# Patient Record
Sex: Male | Born: 1965 | Race: White | Hispanic: No | Marital: Married | Smoking: Never smoker
Health system: Southern US, Academic
[De-identification: ages and names within clinical notes are randomized; demographics above are authoritative.]

## PROBLEM LIST (undated history)

## (undated) DIAGNOSIS — C801 Malignant (primary) neoplasm, unspecified: Secondary | ICD-10-CM

## (undated) DIAGNOSIS — R112 Nausea with vomiting, unspecified: Secondary | ICD-10-CM

## (undated) DIAGNOSIS — G4730 Sleep apnea, unspecified: Secondary | ICD-10-CM

## (undated) DIAGNOSIS — R21 Rash and other nonspecific skin eruption: Secondary | ICD-10-CM

## (undated) DIAGNOSIS — Z9989 Dependence on other enabling machines and devices: Secondary | ICD-10-CM

## (undated) DIAGNOSIS — Z9889 Other specified postprocedural states: Secondary | ICD-10-CM

## (undated) DIAGNOSIS — E785 Hyperlipidemia, unspecified: Secondary | ICD-10-CM

## (undated) DIAGNOSIS — C9030 Solitary plasmacytoma not having achieved remission: Secondary | ICD-10-CM

## (undated) DIAGNOSIS — K219 Gastro-esophageal reflux disease without esophagitis: Secondary | ICD-10-CM

## (undated) DIAGNOSIS — M129 Arthropathy, unspecified: Secondary | ICD-10-CM

## (undated) DIAGNOSIS — I10 Essential (primary) hypertension: Secondary | ICD-10-CM

## (undated) DIAGNOSIS — T884XXA Failed or difficult intubation, initial encounter: Secondary | ICD-10-CM

## (undated) HISTORY — PX: HX OTHER: 2100001105

## (undated) HISTORY — DX: Solitary plasmacytoma not having achieved remission (CMS HCC): C90.30

## (undated) HISTORY — PX: HX KNEE SURGERY: 2100001320

## (undated) HISTORY — PX: HX TONSILLECTOMY: SHX27

---

## 2004-11-25 ENCOUNTER — Ambulatory Visit (HOSPITAL_COMMUNITY): Payer: Self-pay | Admitting: Orthopaedic Surgery

## 2014-08-16 ENCOUNTER — Encounter (HOSPITAL_BASED_OUTPATIENT_CLINIC_OR_DEPARTMENT_OTHER): Payer: Self-pay

## 2014-08-16 ENCOUNTER — Ambulatory Visit (HOSPITAL_BASED_OUTPATIENT_CLINIC_OR_DEPARTMENT_OTHER): Payer: BC Managed Care – PPO

## 2014-08-16 ENCOUNTER — Ambulatory Visit
Admission: RE | Admit: 2014-08-16 | Discharge: 2014-08-16 | Disposition: A | Discharge status: Home / Self Care | Payer: BC Managed Care – PPO | Source: Ambulatory Visit

## 2014-08-16 VITALS — BP 139/79 | HR 81 | Temp 97.9°F | Ht 72.0 in | Wt 298.5 lb

## 2014-08-16 DIAGNOSIS — F17220 Nicotine dependence, chewing tobacco, uncomplicated: Secondary | ICD-10-CM | POA: Insufficient documentation

## 2014-08-16 DIAGNOSIS — M25579 Pain in unspecified ankle and joints of unspecified foot: Secondary | ICD-10-CM

## 2014-08-16 DIAGNOSIS — E780 Pure hypercholesterolemia: Secondary | ICD-10-CM | POA: Insufficient documentation

## 2014-08-16 DIAGNOSIS — Z9889 Other specified postprocedural states: Secondary | ICD-10-CM | POA: Insufficient documentation

## 2014-08-16 DIAGNOSIS — I10 Essential (primary) hypertension: Secondary | ICD-10-CM | POA: Insufficient documentation

## 2014-08-16 DIAGNOSIS — M25771 Osteophyte, right ankle: Secondary | ICD-10-CM | POA: Insufficient documentation

## 2014-08-16 DIAGNOSIS — M19071 Primary osteoarthritis, right ankle and foot: Secondary | ICD-10-CM | POA: Insufficient documentation

## 2014-08-16 DIAGNOSIS — M25871 Other specified joint disorders, right ankle and foot: Secondary | ICD-10-CM | POA: Insufficient documentation

## 2014-08-16 NOTE — H&P (Addendum)
[place], [place] [address]                                PATIENT NAME: [name], [name]  HOSPITAL RSWNIO:[id number]  DATE OF SERVICE:08/16/2014  DATE OF BIRTH: August 30, 1966    HISTORY AND PHYSICAL    CHIEF COMPLAINT:  Right ankle pain.    HISTORY OF PRESENT ILLNESS:  The patient is in today for evaluation of his right ankle pain.  This ankle pain has been going on for almost a decade and is has progressively been getting worse.  He has tried several nonoperative and operative interventions.  He states that the most pain relief he ever got was when he was taking steroids about 5 mg a day.  Advil also helps a little.  He did receive an injection at some point about 8 years ago which provided some relief as well.  He has undergone 3 different surgeries.  He is not sure what they were, but looking at the imaging and talking to him, it sounds like he received an arthroscopic debridement about 8 years ago.  A couple years after that, he underwent what looked like some kind of OATS procedure with a medial malleolar osteotomy and a plantar fascia release and then several years after that, he underwent another surgery for hardware removal and removal of osteophytes.  None of these relieved any of his symptoms.  He continues to have pain, especially with ambulation.  He states that he can walk very little distances before his ankle pain is too much.  He has tried [place] braces which do not help.  He states it does not matter if it is uneven ground or flat ground.  He can go a little bit longer on flat ground but it is still almost negligible.  He has never officially been diagnosed with any kind of rheumatologic disease but a rheumatologist did state that she thought he may have psoriatic arthritis as well as gout.  He denies any trauma to the ankle.  He denies any nausea, vomiting,  fevers, chills, chest pain, shortness breath, diarrhea or constipation.    ALLERGIES:  No known drug allergies.    PAST MEDICAL HISTORY:  Hypercholesterolemia, hypertension, possibly gout and possibly psoriatic arthritis.      CURRENT MEDICATIONS:  1.  Azor.  2.  Nexium.  3.  Lipitor.  4.  Colchicine.    PAST SURGICAL HISTORY:  The 3 ankle surgeries and a knee arthroscopy.    SOCIAL HISTORY:  Chews less than a can a day.  Drinks about 12-30 beers a week.  Denies smoking or drug use.    FAMILY HISTORY:  Significant for hypertension.    REVIEW OF SYSTEMS:  As per HPI, otherwise, all other review of systems are negative.    PHYSICAL EXAMINATION:  General:  No apparent distress.  HEENT:  Normocephalic, atraumatic.  Respirations:  Nonlabored.  Cardiovascular:  Regular rate.  Abdomen:  Nondistended.  Skin:  Warm and dry.  Psych:  Mood and affect appropriate for clinical situation.  Extremity exam:  Right lower extremity reveals no lacerations, abrasions, edema or ecchymosis.  There is a small ankle effusion.  The patient is tender to palpation along the dorsum of his foot around the midfoot as well as in the medial and lateral gutter and in the posterior aspect of the ankle joint.  He is only able to dorsiflex to neutral and that remains unchanged when he flexed the knee.  He can plantar flex about 20 degrees.  Extremes of dorsiflexion and plantar flexion, especially dorsiflexion, cause him a significant amount of tenderness.  When isolating the midfoot and rotating the foot around the midfoot, that is where he is exquisitely tender.  He has very little subtalar range of motion.  He does have intact sensation and brisk capillary refill.  All surgical scars are well-healed.    IMAGING:  X-rays taken today in clinic of his right ankle were reviewed which showed a significant joint space narrowing of the tibiotalar joint as well as the talonavicular joint.  There are some subchondral cysts and sclerotic changes as well as  osteophytes both anterior and posterior of the ankle joint.      An MRI on [employer] was also reviewed which showed multi-joint arthrosis with several subchondral cystic changes.    ASSESSMENT AND PLAN:  The patient has severe right ankle and midfoot arthritis.  We spoke to him about all of his options.  He has pretty much failed every single nonoperative management.  We did talk to him about a fusion, but it would have to be a pantalar fusion and in someone his age, we felt that would not be conducive to his lifestyle and it would cause him just as much pain and discomfort.  We spoke to him about an ankle arthroplasty.  We would actually have to do an ankle arthroplasty and at the same time fuse the subtalar and tibiotalar joint.  We would use a claw plate for the tibiotalar joint and Endo-Fuse rods for the subtalar joint.  The patient was in agreement with this and he wished to proceed.  We turned in a card and we will contact him to plan his surgery.  We did tell him that he had to be completely off tobacco products before we could perform the surgery safely.      Christell Faith, MD  Resident  [place] Department of Orthopaedics    Inda Coke, MD  Assistant Professor  [facility] Department of Orthopaedics    [id number]; D: 08/16/2014 10:59:19; T: 08/16/2014 11:51:16                 I saw and examined the patient.  I directly supervised the resident's activities and procedures.  I reviewed the resident's note.  I agree with the findings and plan of care as documented in the resident's note.    Fulton Reek, MD  Assistant Professor  Chief of Foot & Ankle Surgery  Department of Orthopaedics  [place] [facility]  Operated by [facility]  08/20/2014, 07:56

## 2014-08-18 NOTE — Progress Notes (Signed)
[place],  [id number]                                PATIENT NAME: [name], [name]  HOSPITAL WHQPRF:[id number]  DATE OF SERVICE:08/16/2014  DATE OF BIRTH: [birth year]    PROGRESS NOTE    Procedure is a right total ankle arthroplasty with simultaneous ipsilateral subtalar fusion and talonavicular fusions.  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  A 2-hour surgical window is requested.  Femoral sciatic block with catheter is requested.  The supine position is noted.  The patient will require the large C-arm as well as claw plate, MUC screws, Endo-Fuse rods and the Prophecy to be performed.  We will admit the patient to the [place] unit postoperatively for 1-2 nights.    SPECIAL NOTATION:  The patient needs a nicotine test prior to surgery.  This has to be done 2 weeks prior to be sure that he will heal.    The patient will have an extended period time for mobilization because this is a fusion.  We will not allow him to weightbear until 6 weeks.  We will hopefully start range of motion around the 2-1/2 week mark.    The patient needs to be set up for the prophecy CT scan and medical clearance.      Inda Coke, MD  Assistant Professor  [facility] Department of Orthopaedics    [id number]; D: 08/17/2014 15:44:10; T: 08/18/2014 16:08:33

## 2014-09-04 ENCOUNTER — Other Ambulatory Visit (INDEPENDENT_AMBULATORY_CARE_PROVIDER_SITE_OTHER): Payer: Self-pay | Admitting: Family Medicine

## 2014-09-04 DIAGNOSIS — M25579 Pain in unspecified ankle and joints of unspecified foot: Secondary | ICD-10-CM

## 2014-09-18 ENCOUNTER — Ambulatory Visit
Admission: RE | Admit: 2014-09-18 | Discharge: 2014-09-18 | Disposition: A | Discharge status: Home / Self Care | Payer: BC Managed Care – PPO | Source: Ambulatory Visit | Attending: Family Medicine | Admitting: Family Medicine

## 2014-09-18 DIAGNOSIS — M25579 Pain in unspecified ankle and joints of unspecified foot: Secondary | ICD-10-CM | POA: Insufficient documentation

## 2014-10-24 ENCOUNTER — Encounter (INDEPENDENT_AMBULATORY_CARE_PROVIDER_SITE_OTHER): Payer: Self-pay

## 2014-10-24 ENCOUNTER — Ambulatory Visit (INDEPENDENT_AMBULATORY_CARE_PROVIDER_SITE_OTHER): Payer: Self-pay | Admitting: Family Medicine

## 2014-10-24 ENCOUNTER — Ambulatory Visit
Admission: RE | Admit: 2014-10-24 | Discharge: 2014-10-24 | Disposition: A | Discharge status: Home / Self Care | Payer: BC Managed Care – PPO | Source: Ambulatory Visit

## 2014-10-24 ENCOUNTER — Encounter (INDEPENDENT_AMBULATORY_CARE_PROVIDER_SITE_OTHER): Payer: Self-pay | Admitting: Family Medicine

## 2014-10-24 ENCOUNTER — Ambulatory Visit (HOSPITAL_BASED_OUTPATIENT_CLINIC_OR_DEPARTMENT_OTHER): Payer: BC Managed Care – PPO | Admitting: Family Medicine

## 2014-10-24 VITALS — BP 129/85 | HR 79 | Temp 98.1°F | Wt 299.8 lb

## 2014-10-24 DIAGNOSIS — M13871 Other specified arthritis, right ankle and foot: Secondary | ICD-10-CM | POA: Insufficient documentation

## 2014-10-24 DIAGNOSIS — Z01818 Encounter for other preprocedural examination: Secondary | ICD-10-CM

## 2014-10-24 DIAGNOSIS — M25579 Pain in unspecified ankle and joints of unspecified foot: Principal | ICD-10-CM

## 2014-10-24 DIAGNOSIS — Z87891 Personal history of nicotine dependence: Secondary | ICD-10-CM | POA: Insufficient documentation

## 2014-10-24 HISTORY — DX: Hyperlipidemia, unspecified: E78.5

## 2014-10-24 HISTORY — DX: Sleep apnea, unspecified: G47.30

## 2014-10-24 HISTORY — DX: Gastro-esophageal reflux disease without esophagitis: K21.9

## 2014-10-24 HISTORY — DX: Rash and other nonspecific skin eruption: R21

## 2014-10-24 HISTORY — DX: Essential (primary) hypertension: I10

## 2014-10-24 HISTORY — DX: Failed or difficult intubation, initial encounter: T88.4XXA

## 2014-10-24 HISTORY — DX: Dependence on other enabling machines and devices: Z99.89

## 2014-10-24 LAB — PERFORM POC WHOLE BLOOD GLUCOSE: GLUCOSE, POINT OF CARE: 97 mg/dL (ref 70–105)

## 2014-10-24 NOTE — Anesthesia Preprocedure Evaluation (Addendum)
Physical Exam:     Airway       Mallampati: IV    TM distance: <3 FB    Neck ROM: full  Mouth Opening: poor.            Dental       Dentition intact             Pulmonary    Breath sounds clear to auscultation  (-) no rhonchi, no decreased breath sounds, no wheezes, no rales and no stridor     Cardiovascular    Rhythm: regular  Rate: Normal  (-) no friction rub, carotid bruit is not present, no peripheral edema and no murmur     Other findings          OR scheduling notified of poss difficult intubation, pt scheduled on [facility].    Anesthesia Plan:  Planned anesthesia type: general  ASA 3     Intravenous induction   Patient's NPO status is appropriate for Anesthesia.    Anesthetic plan and risks discussed with patient.    Anesthesia issues/risks discussed are: Stroke, Intraoperative Awareness/ Recall, Cardiac Events/MI, Aspiration and PONV.    Use of blood products discussed with patient whom.     Plan discussed with CRNA.                     EKG: N/A  CXR: N/A  Other Studies: None      Consults: medical clearance Dr. Perlie Mayo with labs, EKG and cxr    Patient instructed to take the following medications day of surgery, nexium.Copy of Anesthesia Consent provided to patient or guardian to review prior to procedure. Pt instructed that consent will be signed prior to procedure with anesthesiologist.Instructed to hold vitamins/herbs and NSAID 1 week prior to procedure.Instructed to hold ace inhibitor/ARB/Renin inhibitor 24 hours prior to surgery.

## 2014-10-27 LAB — NICOTINE AND METABOLITES, SERUM
COTININE SERUM: <2 ng/mL
NICOTINE SERUM: <2 ng/mL

## 2014-10-29 NOTE — H&P (Addendum)
[place], [place] [address]                                PATIENT NAME: [name], [name]  HOSPITAL IWPYKD:[id number]  DATE OF SERVICE:10/24/2014  DATE OF BIRTH: 06-Jan-1966    HISTORY AND PHYSICAL    SUBJECTIVE:  [name] is here today for his history and physical.  Operative plan is a right total ankle arthroplasty with ipsilateral subtalar fusion and talonavicular fusion as well.  It was noted that a nicotine test will be obtained at today's visit, which he states he has been nicotine-free for the past month.  He does chew tobacco but is using a nicotine-free type of chewing tobacco which he shows me at today's visit.  The patient had presented to the foot and ankle clinic on August 14, 2014, in regards to right ankle pain that has been going on for almost a decade and progressively getting worse.  He has tried several nonoperative interventions in the past.  He states most of his pain relief he has gotten when he is taking steroids.  Advil helps slightly as well.  He has received injections in the past which has provided minimal relief.  He has undergone 3 different surgeries, which we believe to be an arthroscopic debridement from what he had described as well as some sort of OATS procedure with a medial malleolar osteotomy and plantar fascia release several years after that as well.  He states that he then had hardware removal and removal of osteophytes thereafter.  Symptoms have not been relieved.  He continues to have pain, especially with ambulation.  He can walk very little distances before his ankle becomes increasingly painful.  He has tried an [place] brace in the past which did not help.  He has never officially been diagnosed with a rheumatologic disease but a rheumatologist did state that they had thought that he had psoriatic arthritis as well as gout  which he states he is out of his colchicine at today's visit.    ALLERGIES:  No known drug allergies are listed.    PAST MEDICAL HISTORY:  Hypercholesterolemia, hypertension, possibly gout and possibly psoriatic arthritis per patient.    CURRENT MEDICATIONS:    1.  Nexium.  2.  Lipitor.  3.  Colchicine.    PAST SURGICAL HISTORY:  Three ankle surgeries as noted above and a knee arthroscopy.    SOCIAL HISTORY:  He does chew tobacco, which he states he has gone to a nicotine-free type of chewing tobacco for the past month.  He drinks 12-30 beers a week and denies smoking or drug use.    FAMILY HISTORY:  Significant for hypertension.    REVIEW OF SYSTEMS:  As per HPI, other systems reviewed and are negative.    PHYSICAL EXAMINATION:  The patient is in no acute distress.  Vital signs:  Blood pressure is 129/85, pulse 79, temperature is 98 degrees Fahrenheit, weight is 136 kg, height was not obtained.  The patient is atraumatic, normocephalic, nonlabored breath sounds, nondistended abdomen,  no lymphedema.  Mood is appropriate for the clinical setting.  Skin:  There are no rashes or pruritus noted.  On exam of his right lower extremity, he has no lacerations, abrasions or edema noted.  There is a small ankle effusion present.  He is tender to palpation along the dorsum of his foot around the midfoot, as well as medial and lateral gutter.  He is only able to dorsiflex to neutral, plantarflex to about 20 degrees and very little subtalar motion is noted.  Sensation is intact to light touch.  Brisk capillary refill is present.  All surgical scars are well-healed.    ASSESSMENT:  Severe right ankle and midfoot arthritis.    PLAN:  A fusion was discussed with him at his last office visit but with his lifestyle, he wants to proceed forward with an ankle arthroplasty as well as a subtalar tibiotalar joint fusion.  He was also informed that he will be nicotine tested at today's visit as well if this comes back positive prior to his  procedure that his surgery will be canceled and if it comes back positive after the procedure was performed that he has an increased risk for wound complications and delayed healing.  The patient understood these risks and benefits and he is electing to proceed forward with that this surgery as planned.  A note is dictated by Dr. Irena Cords in regards to this surgery and can be reviewed prior to the procedure.  All questions and concerns were answered.  Consent was obtained and he is to proceed to preadmission testing.      Lillie Columbia, PA-C  [place] Department of Orthopaedics    Inda Coke, MD  Assistant Professor   Department of Orthopaedics    [id number]; D: 10/29/2014 08:23:18; T: 10/29/2014 08:50:44                 Fulton Reek, MD  Assistant Professor  Chief of Foot & Ankle Surgery  Department of [place] [facility]  Operated by [facility] [employer]  10/29/2014, 12:46

## 2014-11-02 MED ORDER — CEFAZOLIN 10 GRAM SOLUTION FOR INJECTION
3.00 g | Freq: Once | INTRAMUSCULAR | Status: AC
Start: 2014-11-05 — End: 2014-11-05
  Administered 2014-11-05 (×2): 3 g via INTRAVENOUS
  Filled 2014-11-02: qty 30

## 2014-11-02 MED ORDER — DEXTROSE 5 % IN WATER (D5W) INTRAVENOUS SOLUTION
3.00 g | Freq: Once | INTRAVENOUS | Status: DC
Start: 2014-11-05 — End: 2014-11-05
  Filled 2014-11-02: qty 30

## 2014-11-05 ENCOUNTER — Inpatient Hospital Stay (HOSPITAL_COMMUNITY): Admit: 2014-11-05 | Discharge: 2014-11-05 | Disposition: A | Discharge status: Home / Self Care | Payer: Self-pay

## 2014-11-05 ENCOUNTER — Inpatient Hospital Stay (HOSPITAL_COMMUNITY): Payer: BC Managed Care – PPO | Admitting: Family

## 2014-11-05 ENCOUNTER — Encounter (HOSPITAL_COMMUNITY): Admission: RE | Disposition: A | Discharge status: Home / Self Care | Payer: Self-pay | Source: Ambulatory Visit

## 2014-11-05 ENCOUNTER — Inpatient Hospital Stay (HOSPITAL_COMMUNITY): Payer: BC Managed Care – PPO

## 2014-11-05 ENCOUNTER — Inpatient Hospital Stay (HOSPITAL_COMMUNITY): Payer: BC Managed Care – PPO | Admitting: Anesthesiology

## 2014-11-05 ENCOUNTER — Inpatient Hospital Stay (HOSPITAL_COMMUNITY)
Admission: RE | Admit: 2014-11-05 | Discharge: 2014-11-05 | Disposition: A | Discharge status: Home / Self Care | Payer: BC Managed Care – PPO | Source: Ambulatory Visit

## 2014-11-05 ENCOUNTER — Inpatient Hospital Stay
Admission: RE | Admit: 2014-11-05 | Discharge: 2014-11-06 | DRG: 470 | Disposition: A | Discharge status: Home / Self Care | Payer: BC Managed Care – PPO | Source: Ambulatory Visit

## 2014-11-05 ENCOUNTER — Encounter (HOSPITAL_COMMUNITY): Payer: Self-pay

## 2014-11-05 ENCOUNTER — Inpatient Hospital Stay (HOSPITAL_COMMUNITY): Payer: BC Managed Care – PPO | Admitting: ANESTHESIOLOGY

## 2014-11-05 DIAGNOSIS — M25571 Pain in right ankle and joints of right foot: Secondary | ICD-10-CM

## 2014-11-05 DIAGNOSIS — M109 Gout, unspecified: Secondary | ICD-10-CM | POA: Diagnosis present

## 2014-11-05 DIAGNOSIS — E780 Pure hypercholesterolemia: Secondary | ICD-10-CM | POA: Diagnosis present

## 2014-11-05 DIAGNOSIS — L4050 Arthropathic psoriasis, unspecified: Secondary | ICD-10-CM | POA: Diagnosis present

## 2014-11-05 DIAGNOSIS — I10 Essential (primary) hypertension: Secondary | ICD-10-CM | POA: Diagnosis present

## 2014-11-05 DIAGNOSIS — M19071 Primary osteoarthritis, right ankle and foot: Secondary | ICD-10-CM

## 2014-11-05 DIAGNOSIS — F17220 Nicotine dependence, chewing tobacco, uncomplicated: Secondary | ICD-10-CM | POA: Diagnosis present

## 2014-11-05 DIAGNOSIS — G8918 Other acute postprocedural pain: Secondary | ICD-10-CM

## 2014-11-05 DIAGNOSIS — M19079 Primary osteoarthritis, unspecified ankle and foot: Secondary | ICD-10-CM | POA: Diagnosis present

## 2014-11-05 DIAGNOSIS — Z8249 Family history of ischemic heart disease and other diseases of the circulatory system: Secondary | ICD-10-CM

## 2014-11-05 SURGERY — ARTHROPLASTY ANKLE TOTAL
Anesthesia: General | Laterality: Right | Wound class: Clean Wound: Uninfected operative wounds in which no inflammation occurred

## 2014-11-05 MED ORDER — PROPOFOL 10 MG/ML IV BOLUS
INJECTION | Freq: Once | INTRAVENOUS | Status: DC | PRN
Start: 2014-11-05 — End: 2014-11-05
  Administered 2014-11-05: 100 mg via INTRAVENOUS
  Administered 2014-11-05: 200 mg via INTRAVENOUS

## 2014-11-05 MED ORDER — AMLODIPINE 5 MG-OLMESARTAN 20 MG TABLET
1.0000 | ORAL_TABLET | Freq: Every day | ORAL | Status: DC
Start: 2014-11-05 — End: 2014-11-05

## 2014-11-05 MED ORDER — ATORVASTATIN 10 MG TABLET
10.0000 mg | ORAL_TABLET | Freq: Every day | ORAL | Status: DC
Start: 2014-11-05 — End: 2014-11-06
  Administered 2014-11-05 – 2014-11-06 (×2): 10 mg via ORAL
  Filled 2014-11-05 (×2): qty 1

## 2014-11-05 MED ORDER — DEXTROSE 5 % IN WATER (D5W) INTRAVENOUS SOLUTION
2.0000 g | Freq: Three times a day (TID) | INTRAVENOUS | Status: AC
Start: 2014-11-05 — End: 2014-11-06
  Administered 2014-11-05: 2 g via INTRAVENOUS
  Administered 2014-11-06: 0 g via INTRAVENOUS
  Administered 2014-11-06: 2 g via INTRAVENOUS
  Administered 2014-11-06: 0 g via INTRAVENOUS
  Administered 2014-11-06: 2 g via INTRAVENOUS
  Filled 2014-11-05 (×4): qty 20

## 2014-11-05 MED ORDER — ASPIRIN 325 MG TABLET,DELAYED RELEASE
325.0000 mg | DELAYED_RELEASE_TABLET | Freq: Every day | ORAL | Status: AC
Start: 2014-11-05 — End: 2014-12-17

## 2014-11-05 MED ORDER — VALSARTAN 160 MG TABLET
160.0000 mg | ORAL_TABLET | Freq: Every day | ORAL | Status: DC
Start: 2014-11-05 — End: 2014-11-06
  Administered 2014-11-05 – 2014-11-06 (×2): 160 mg via ORAL
  Filled 2014-11-05 (×2): qty 1

## 2014-11-05 MED ORDER — NEOSTIGMINE METHYLSULFATE 1 MG/ML INTRAVENOUS SOLUTION
Freq: Once | INTRAVENOUS | Status: DC | PRN
Start: 2014-11-05 — End: 2014-11-05
  Administered 2014-11-05: 4 mg via INTRAVENOUS

## 2014-11-05 MED ORDER — HYDROMORPHONE 1 MG/ML INJECTION WRAPPER
0.20 mg | INJECTION | INTRAMUSCULAR | Status: DC | PRN
Start: 2014-11-05 — End: 2014-11-05
  Filled 2014-11-05 (×2): qty 1

## 2014-11-05 MED ORDER — MIDAZOLAM 1 MG/ML INJECTION SOLUTION
INTRAMUSCULAR | Status: AC
Start: 2014-11-05 — End: 2014-11-05
  Filled 2014-11-05: qty 2

## 2014-11-05 MED ORDER — MIDAZOLAM 1 MG/ML INJECTION SOLUTION
2.00 mg | Freq: Once | INTRAMUSCULAR | Status: DC | PRN
Start: 2014-11-05 — End: 2014-11-05
  Administered 2014-11-05: 2 mg via INTRAVENOUS

## 2014-11-05 MED ORDER — SODIUM CHLORIDE 0.9 % INTRAVENOUS SOLUTION
6.25 mg | Freq: Once | INTRAVENOUS | Status: DC | PRN
Start: 2014-11-05 — End: 2014-11-05

## 2014-11-05 MED ORDER — ONDANSETRON HCL (PF) 4 MG/2 ML INJECTION SOLUTION
4.00 mg | Freq: Once | INTRAMUSCULAR | Status: DC | PRN
Start: 2014-11-05 — End: 2014-11-05

## 2014-11-05 MED ORDER — ROPIVACAINE (PF) 5 MG/ML (0.5 %) INJECTION SOLUTION
30.00 mL | Freq: Once | INTRAMUSCULAR | Status: DC | PRN
Start: 2014-11-05 — End: 2014-11-05
  Administered 2014-11-05: 30 mL via INTRAMUSCULAR
  Filled 2014-11-05: qty 30

## 2014-11-05 MED ORDER — ROPIVACAINE (PF) 5 MG/ML (0.5 %) INJECTION SOLUTION
10.00 mL | Freq: Once | INTRAMUSCULAR | Status: AC
Start: 2014-11-05 — End: 2014-11-05
  Administered 2014-11-05: 10 mL
  Filled 2014-11-05: qty 10

## 2014-11-05 MED ORDER — SODIUM CHLORIDE 0.9 % (FLUSH) INJECTION SYRINGE
2.00 mL | INJECTION | Freq: Three times a day (TID) | INTRAMUSCULAR | Status: DC
Start: 2014-11-05 — End: 2014-11-06
  Administered 2014-11-05 – 2014-11-06 (×3): 0 mL
  Administered 2014-11-06: 2 mL

## 2014-11-05 MED ORDER — OXYCODONE-ACETAMINOPHEN 5 MG-325 MG TABLET
1.0000 | ORAL_TABLET | ORAL | Status: DC | PRN
Start: 2014-11-05 — End: 2014-11-06

## 2014-11-05 MED ORDER — SENNOSIDES 8.6 MG-DOCUSATE SODIUM 50 MG TABLET
1.00 | ORAL_TABLET | Freq: Two times a day (BID) | ORAL | Status: DC
Start: 2014-11-05 — End: 2015-01-22

## 2014-11-05 MED ORDER — OXYCODONE-ACETAMINOPHEN 5 MG-325 MG TABLET
1.00 | ORAL_TABLET | ORAL | Status: DC | PRN
Start: 2014-11-05 — End: 2015-01-22

## 2014-11-05 MED ORDER — ROCURONIUM 50 MG/5 ML (10 MG/ML) INTRAVENOUS SYRINGE
INJECTION | Freq: Once | INTRAVENOUS | Status: DC | PRN
Start: 2014-11-05 — End: 2014-11-05
  Administered 2014-11-05: 10 mg via INTRAVENOUS
  Administered 2014-11-05: 50 mg via INTRAVENOUS
  Administered 2014-11-05: 10 mg via INTRAVENOUS

## 2014-11-05 MED ORDER — ONDANSETRON HCL (PF) 4 MG/2 ML INJECTION SOLUTION
4.0000 mg | Freq: Four times a day (QID) | INTRAMUSCULAR | Status: DC | PRN
Start: 2014-11-05 — End: 2014-11-06

## 2014-11-05 MED ORDER — AMLODIPINE 5 MG TABLET
5.0000 mg | ORAL_TABLET | Freq: Every day | ORAL | Status: DC
Start: 2014-11-05 — End: 2014-11-06
  Administered 2014-11-05 – 2014-11-06 (×2): 5 mg via ORAL
  Filled 2014-11-05 (×2): qty 1

## 2014-11-05 MED ORDER — SODIUM CHLORIDE 0.9 % (FLUSH) INJECTION SYRINGE
20.00 mL | INJECTION | Freq: Once | INTRAMUSCULAR | Status: DC | PRN
Start: 2014-11-05 — End: 2014-11-05

## 2014-11-05 MED ORDER — SENNOSIDES 8.6 MG-DOCUSATE SODIUM 50 MG TABLET
1.0000 | ORAL_TABLET | Freq: Two times a day (BID) | ORAL | Status: DC
Start: 2014-11-05 — End: 2014-11-06
  Administered 2014-11-05 – 2014-11-06 (×3): 1 via ORAL
  Filled 2014-11-05 (×5): qty 1

## 2014-11-05 MED ORDER — ACETAMINOPHEN 1,000 MG/100 ML (10 MG/ML) INTRAVENOUS SOLUTION
Freq: Once | INTRAVENOUS | Status: DC | PRN
Start: 2014-11-05 — End: 2014-11-05
  Administered 2014-11-05: 1000 mg via INTRAVENOUS

## 2014-11-05 MED ORDER — GLYCOPYRROLATE 0.2 MG/ML INJECTION SOLUTION
Freq: Once | INTRAMUSCULAR | Status: DC | PRN
Start: 2014-11-05 — End: 2014-11-05
  Administered 2014-11-05: 0.4 mg via INTRAVENOUS

## 2014-11-05 MED ORDER — LACTATED RINGERS INTRAVENOUS SOLUTION
INTRAVENOUS | Status: DC
Start: 2014-11-05 — End: 2014-11-06

## 2014-11-05 MED ORDER — SODIUM CHLORIDE 0.9 % INTRAVENOUS SOLUTION
INTRAVENOUS | Status: DC
Start: 2014-11-05 — End: 2014-11-06
  Filled 2014-11-05: qty 110

## 2014-11-05 MED ORDER — EPHEDRINE SULFATE 50 MG/ML INJECTION SOLUTION
Freq: Once | INTRAMUSCULAR | Status: DC | PRN
Start: 2014-11-05 — End: 2014-11-05
  Administered 2014-11-05 (×2): 10 mg via INTRAVENOUS

## 2014-11-05 MED ORDER — OXYCODONE-ACETAMINOPHEN 5 MG-325 MG TABLET
2.0000 | ORAL_TABLET | ORAL | Status: DC | PRN
Start: 2014-11-05 — End: 2014-11-06
  Administered 2014-11-05 – 2014-11-06 (×7): 2 via ORAL
  Filled 2014-11-05 (×7): qty 2

## 2014-11-05 MED ORDER — FENTANYL (PF) 50 MCG/ML INJECTION SOLUTION
100.00 ug | Freq: Once | INTRAMUSCULAR | Status: DC | PRN
Start: 2014-11-05 — End: 2014-11-05
  Administered 2014-11-05: 50 ug via INTRAVENOUS
  Administered 2014-11-05: 100 ug via INTRAVENOUS
  Administered 2014-11-05: 50 ug via INTRAVENOUS

## 2014-11-05 MED ORDER — DEXAMETHASONE SODIUM PHOSPHATE 4 MG/ML INJECTION SOLUTION
4.00 mg | Freq: Once | INTRAMUSCULAR | Status: DC | PRN
Start: 2014-11-05 — End: 2014-11-05

## 2014-11-05 MED ORDER — HYDROMORPHONE 1 MG/ML INJECTION WRAPPER
0.40 mg | INJECTION | INTRAMUSCULAR | Status: DC | PRN
Start: 2014-11-05 — End: 2014-11-06
  Administered 2014-11-05 – 2014-11-06 (×5): 0.4 mg via INTRAVENOUS
  Filled 2014-11-05 (×5): qty 1

## 2014-11-05 MED ORDER — HYDROMORPHONE 1 MG/ML INJECTION WRAPPER
0.2000 mg | INJECTION | INTRAMUSCULAR | Status: DC | PRN
Start: 2014-11-05 — End: 2014-11-05

## 2014-11-05 MED ORDER — SODIUM CHLORIDE 0.9 % (FLUSH) INJECTION SYRINGE
2.00 mL | INJECTION | INTRAMUSCULAR | Status: DC | PRN
Start: 2014-11-05 — End: 2014-11-06

## 2014-11-05 MED ORDER — ASPIRIN 325 MG TABLET,DELAYED RELEASE
325.0000 mg | DELAYED_RELEASE_TABLET | Freq: Every day | ORAL | Status: DC
Start: 2014-11-05 — End: 2014-11-06
  Administered 2014-11-05 – 2014-11-06 (×2): 325 mg via ORAL
  Filled 2014-11-05 (×2): qty 1

## 2014-11-05 MED ORDER — ONDANSETRON HCL (PF) 4 MG/2 ML INJECTION SOLUTION
Freq: Once | INTRAMUSCULAR | Status: DC | PRN
Start: 2014-11-05 — End: 2014-11-05
  Administered 2014-11-05: 4 mg via INTRAVENOUS

## 2014-11-05 MED ORDER — FENTANYL (PF) 50 MCG/ML INJECTION SOLUTION
INTRAMUSCULAR | Status: AC
Start: 2014-11-05 — End: 2014-11-05
  Filled 2014-11-05: qty 2

## 2014-11-05 MED ORDER — HYDROMORPHONE 1 MG/ML INJECTION WRAPPER
0.40 mg | INJECTION | INTRAMUSCULAR | Status: AC | PRN
Start: 2014-11-05 — End: 2014-11-05
  Administered 2014-11-05 (×5): 0.4 mg via INTRAVENOUS
  Filled 2014-11-05: qty 1

## 2014-11-05 MED ORDER — REMIFENTANIL 50 MCG/ML INFUSION - FOR ANES
INTRAVENOUS | Status: DC | PRN
Start: 2014-11-05 — End: 2014-11-05
  Administered 2014-11-05: 0 ug/kg/min via INTRAVENOUS
  Administered 2014-11-05: 0.05 ug/kg/min via INTRAVENOUS
  Administered 2014-11-05: .07 ug/kg/min via INTRAVENOUS

## 2014-11-05 MED ORDER — SODIUM CHLORIDE 0.9 % (FLUSH) INJECTION SYRINGE
2.00 mL | INJECTION | Freq: Three times a day (TID) | INTRAMUSCULAR | Status: DC
Start: 2014-11-05 — End: 2014-11-06
  Administered 2014-11-05: 0 mL
  Administered 2014-11-06: 2 mL
  Administered 2014-11-06: 0 mL

## 2014-11-05 MED ORDER — LIDOCAINE (PF) 100 MG/5 ML (2 %) INTRAVENOUS SYRINGE
INJECTION | Freq: Once | INTRAVENOUS | Status: DC | PRN
Start: 2014-11-05 — End: 2014-11-05
  Administered 2014-11-05: 80 mg via INTRAVENOUS

## 2014-11-05 MED ADMIN — lactated Ringers intravenous solution: INTRAVENOUS | @ 07:00:00 | NDC 00338011704

## 2014-11-05 MED ADMIN — HYDROmorphone (PF) 1 mg/mL injection solution: INTRAVENOUS | @ 12:00:00

## 2014-11-05 MED ADMIN — SODIUM CHLORIDE 0.9 % W/ ADDITIVES: INTRAVENOUS | @ 12:00:00 | NDC 00338004903

## 2014-11-05 MED ADMIN — electrolyte-A intravenous solution: ORAL | @ 16:00:00 | NDC 00338022104

## 2014-11-05 MED ADMIN — nystatin 100,000 unit/gram topical powder: @ 11:00:00 | NDC 00574200802

## 2014-11-05 MED ADMIN — lactated Ringers intravenous solution: INTRAVENOUS | @ 23:00:00 | NDC 00264775000

## 2014-11-05 SURGICAL SUPPLY — 59 items
BIT DRILL 4.4MM DARCO FOOT ANKL 6.5MM SCREW (SURGICAL CUTTING SUPPLIES) ×1 IMPLANT
BIT DRILL 4.4MM DARCO FT/ANKL_6.5MM SCREW (CUTTING ELEMENTS) ×1
BIT DRILL GRN 2MM CLW II 2.7MM SCREW (SURGICAL CUTTING SUPPLIES) ×1 IMPLANT
BIT DRILL GRN 2MM CLW II 2.7MM_SCREW (CUTTING ELEMENTS) ×1
BLADE SAW 90X11X1.19MM SGTL 2 CUT (SURGICAL CUTTING SUPPLIES) ×1 IMPLANT
BLADE SAW 90X11X1.19MM SGTL 2_CUT (CUTTING ELEMENTS) ×1
BLANKET 3M BAIR HUG ADLT UPR B ODY 74X24IN PLMR 2 INCS ADH (MISCELLANEOUS PT CARE ITEMS) ×2 IMPLANT
CUFF TOURNIQUET PURP 34X4IN COLOR CUF CYL 1 PORT 1 BLADDER QC CONN 40IN STRL LF  DISP (GENE) ×1 IMPLANT
CUFF TOURNIQUET PURP 34X4IN_COLOR CUF CYL 1 PORT 1 BLADDER (GENE) ×1
DOME TALAR INBONE 3 ANKL SULCUS ×1 IMPLANT
DONUT EXTREMITY CUSHIONING 31143137 (POSITIONING PRODUCTS) ×2 IMPLANT
DRAPE BACKTABLE 72IN 10/CS 37501 (Other Miscellaneous) ×2 IMPLANT
DRAPE FNFLD ABS REINF 77X53IN 43528 PRXM LF  STRL DISP SURG SMS 44X23IN (PROTECTIVE PRODUCTS/GARMENTS) IMPLANT
DRAPE FNFLD ABS REINF 77X53IN_43528 PRXM LF STRL DISP SURG (PROTECTIVE PRODUCTS/GARMENTS)
DRILL BIT 3.0MM (ORTHOPEDICS (NOT IMPLANTS)) ×2 IMPLANT
DRILL INBONE SZ 4 200178004_EA (CUTTING ELEMENTS) ×1
DRILL SURG 4 ANTIROTATION NTCH INBONE (SURGICAL CUTTING SUPPLIES) ×1 IMPLANT
DRILL SURG INBONE 6MM NONST DISP (SURGICAL INSTRUMENTS) ×1 IMPLANT
DRILL SURG INBONE 6MM NONST DI_SP (INSTRUMENTS) ×1
DUPE USE ITEM 319452 - SUTURE 0 CT2 VICRYL 27IN VIOL_BRD COAT ABS (SUTURE/WOUND CLOSURE) ×1 IMPLANT
Enod-fuse fusion rod 3mm diameter, 55mm length ×2 IMPLANT
GARMENT COMPRESS MED CALF CENTAURA NYL VASOGRAD LTWT BRTHBL SEQ FIL BLU 18- IN (ORTHOPEDICS (NOT IMPLANTS)) ×1 IMPLANT
GARMENT COMPRESS MED CALF CENT_AURA NYL VASOGRAD LTWT BRTHBL (ORTHOPEDICS (NOT IMPLANTS)) ×1
GUIDE STEM ALGN INBONE PROPHECY SURG TIB (ORTHOPEDICS (NOT IMPLANTS)) ×1 IMPLANT
HANDPC SUCT MEDIVAC YANKAUER BLBS TIP CLR STRL LF  DISP (Suction) IMPLANT
HANDPC SUCT MEDIVAC YANKAUER B_LBS TIP CLR STRL LF DISP (Suction)
INBONE PROPHECY PROPINB_EA (ORTHOPEDICS (NOT IMPLANTS)) ×1
Inbone poly insert size +3, 10mm thickness ×1 IMPLANT
Inbone talar stem 14mm ×1 IMPLANT
KIT RM TURNOVER CLEANOP CSTM INFCT CONTROL (KITS & TRAYS (DISPOSABLE)) ×1
KIT RM TURNOVER CLEANOP CSTM I_NFCT CONTROL (KITS & TRAYS (DISPOSABLE)) ×1
KIT RM TURNOVER CLEANOP CUSTOM INFCT CONTROL (KITS & TRAYS (DISPOSABLE)) ×1 IMPLANT
KWIRE 1.4MM 500036_EA (ORTHOPEDICS (NOT IMPLANTS)) ×5
PACK FOOT AND ANKLE CUSTOM_1/CS (CUSTOM TRAYS & PACK) ×1
PACK SURG CSTM FOOT ANKL NONST DISP LF (CUSTOM TRAYS & PACK) ×1
PACK SURG CUSTOM FOOT ANKL NONST DISP LF (CUSTOM TRAYS & PACK) ×1 IMPLANT
PAD ARMBOARD FOAM BLU_FP-ECARM (POSITIONING PRODUCTS) ×2
PAD ARMBRD BLU (POSITIONING PRODUCTS) ×2 IMPLANT
PIN FIX 2.4MM STEINMANN (ORTHOPEDICS (NOT IMPLANTS)) ×32 IMPLANT
PLATE CLW II 20MM 2 H LOCK THREAD COMPRESS POLYAX SS FOOT ANKL SM UNIV BONE 2.7/3.5MM SCREW ×2 IMPLANT
SCREW BONE ORTHOLOC 3.5MM 20MM SLF TAP LOCK POLYAX SS CRTX 15D PURP CLW II COMPRESS PLATE SYS ×1 IMPLANT
SCREW BONE ORTHOLOC 3.5MM 26MM SLF TAP LOCK POLYAX SS CRTX 15D PURP CLW II COMPRESS PLATE SYS ×1 IMPLANT
SCREW BONE ORTHOLOC 3.5MM 32MM SLF TAP LOCK POLYAX SS CRTX 15D PURP CLW II COMPRESS PLATE SYS ×1 IMPLANT
SCREW BONE ORTHOLOC 3.5MM 34MM SLF TAP LOCK POLYAX SS CRTX 15D PURP CLW II COMPRESS PLATE SYS ×1 IMPLANT
SPLINT CAST 30X5IN SMOOTH FNSH_MOIST RST LOW EXOTHERM SPCLST (CAST) IMPLANT
SPONGE GAUZE STRL 4 X 4IN TUB_6939 1280/CS (WOUND CARE SUPPLY) ×1 IMPLANT
SPONGE GAUZE STRL 4 X 4IN TUB_6939 1280/CS (WOUND CARE/ENTEROSTOMAL SUPPLY) ×1
STEM TIB INBONE BS PLAS ANKL 18MM ×1 IMPLANT
STEM TIB INBONE MID PLAS ANKL 16MM ×2 IMPLANT
STEM TIB INBONE TOP PLAS ANKL 16MM ×1 IMPLANT
SUTURE 0 CT2 VICRYL 27IN VIOL_BRD COAT ABS (SUTURE/WOUND CLOSURE) ×1
SUTURE 2-0 SH VICRYL 27IN UNDYED BRD COAT ABS (SUTURE/WOUND CLOSURE) ×1 IMPLANT
SUTURE 2-0 SH VICRYL 27IN UNDY_ED BRD COAT ABS (SUTURE/WOUND CLOSURE) ×1
SUTURE SILK 2-0 SH PERMAHAND 30IN BLK CR BRD 8 STRN NONAB (SUTURE/WOUND CLOSURE) ×2 IMPLANT
SUTURE SILK 2-0 SH PERMAHAND 3_0IN BLK CR BRD 8 STRN NONAB (SUTURE/WOUND CLOSURE) ×2
TIB ANKL RGT 4 STD INBONE TRY ×1 IMPLANT
TIP SUCT ARGYLE CURITY FRZR 8FR BRSS PLASTIC NI REM OBTURATOR NCDTV HNDL PLBL STRL LF  DISP (SURGICAL INSTRUMENTS) ×1 IMPLANT
TUBE FRAZIER INST SUCT 8FR_166024 (INSTRUMENTS) ×1
WIRE FIX 1.4MM 228MM KIRSCH (ORTHOPEDICS (NOT IMPLANTS)) ×5 IMPLANT

## 2014-11-05 NOTE — OR PostOp (Signed)
Brother  phoned in waiting room with update on condition and plan of care.

## 2014-11-05 NOTE — OR PreOp (Signed)
Patient in 5N preop.  Wife at bedside.  Preop routine reviewed.

## 2014-11-05 NOTE — Brief Op Note (Addendum)
[place]                                                     BRIEF OPERATIVE NOTE    Patient Name: [facility] Number: [id number]  Date of Service: 11/05/2014   Date of Birth: 24-Dec-1965      Pre-Operative Diagnosis: R severe ankle, subtalar, and talonavicular osteoarthritis   Post-Operative Diagnosis: same  Procedure(s)/Description:   1. R total ankle arthroplasty  2. R subtalar fusion  3. R talonavicular fusion    Findings/Complexity (inherent to the procedure performed): as above     Attending Surgeon: Irena Cords  Assistant(s): Jacquelin Hawking    Anesthesia Type: General endotracheal anesthesia  Estimated Blood Loss:  Minimal  Blood Given: none  Fluids Given: Crystalloid  Complications (unintended/unexpected/iatrogenic/accidental/inadvertent events):  none  Wound Class: Clean Wound: Uninfected operative wounds in which no inflammation occurred    Tubes: None  Drains: None  Specimens/ Cultures: none  Implants: See dictated note           Disposition: PACU - hemodynamically stable.  Condition: stable    Melina Copa, MD 11/05/2014, 10:35                            I saw and examined the patient.  I directly supervised the resident's activities and procedures.  I reviewed the resident's note.  I agree with the findings and plan of care as documented in the resident's note.    Fulton Reek, MD  Assistant Professor  Chief of Foot & Ankle Surgery  Department of Orthopaedics  [place] [facility]  Operated by [facility] [employer]  11/05/2014, 10:48

## 2014-11-05 NOTE — Progress Notes (Addendum)
Orthopaedic Progress Note    Admit Date:11/05/2014  Date: 11/05/2014    Days Post op: 11/05/14  Operation:   1. R total ankle arthroplasty  2. R subtalar fusion  3. R talonavicular fusion       Subjective: resting in PACU    Objective:  Vitals:   Filed Vitals:    11/05/14 0648 11/05/14 0653 11/05/14 0700 11/05/14 1053   BP: 131/65 135/89 129/79 156/100   Pulse: 86 83 82 94   Temp:    36.8 C (98.2 F)   Resp: 18 17 16 18    SpO2: 99% 99% 98% 97%       NAD, resting comfortably in bed  Breathing non-labored  Exam limited by block  Able to wiggle toes on command  BCR toes  Toes warm and well-perfused appearing    Assessment:  Pt is a 49 y.o. male s/p:  1. R total ankle arthroplasty  2. R subtalar fusion  3. R talonavicular fusion    Plan:  Pain control with PO meds  DVT ppx with ASA 325mg  daily  24 abx  NWB RLE  Maintain splint, keep dressing clean and dry]  F/u Feb 5th [facility]      Chryl Heck, MD  11/05/2014, 11:01  PGY-1 ORTHOPAEDICS  PAGER # [phone]            I saw and examined the patient.  I directly supervised the resident's activities and procedures.  I reviewed the resident's note.  I agree with the findings and plan of care as documented in the resident's note.    Tarry Kos, MD  Assistant Professor  Chief of Foot & Ankle Surgery  Department of Orthopaedics  [facility]  Operated by [employer]  11/05/2014, 13:41

## 2014-11-05 NOTE — H&P (Addendum)
[facility]                                                     H&P Update Form    [name],[place], 49 y.o. male  Date of Admission:  11/05/2014  Date of Birth:  19-Aug-1966    11/05/2014    STOP: IF H&P IS GREATER THAN 30 DAYS FROM SURGICAL DAY COMPLETE NEW H&P IS REQUIRED.     H & P updated the day of the procedure.  1.  H&P completed within 30 days of surgical procedure  and has been reviewed, the patient has been examined, and no change has occured in the patients condition since the H&P was completed.       Change in medications: No      Last Menstrual Period: Not applicable      Comments:     2.  Patient continues to be appropiate candidate for planned surgical procedure. YES      Harriett Sineavid Benjamin McConda, MD  11/05/2014  06:25                I saw and examined the patient.  I directly supervised the resident's activities and procedures.  I reviewed the resident's note.  I agree with the findings and plan of care as documented in the resident's note.    Tarry Kosobert Dale Swati Granberry, MD  Assistant Professor  Chief of Foot & Ankle Surgery  Department of Orthopaedics  [facility]  Operated by [facility]  11/05/2014, 07:06

## 2014-11-05 NOTE — OR Surgeon (Signed)
[place] OF ORTHOPAEDICS                                     OPERATION SUMMARY    PATIENT NAME: [name], [name]  HOSPITAL [id number]  DATE OF SERVICE:11/05/2014  DATE OF BIRTH: 1966/06/11    PREOPERATIVE DIAGNOSIS:  Right end-stage ankle arthrosis, right subtalar arthrosis and right talonavicular arthrosis.    POSTOPERATIVE DIAGNOSIS:  Right end-stage ankle arthrosis, right subtalar arthrosis and right talonavicular arthrosis.    NAME OF PROCEDURE:  Right total ankle arthroplasty, right subtalar fusion, right talonavicular fusion and right short leg splint.    SURGEONS:  Inda Coke MD (staff), Jesse Sans, MD (assistant), Daniel Martinique Shubert, MD (assistant).    ANESTHESIA:  Regional plus general.    DISPOSITION:  Postanesthesia recovery room, stable.    INDICATION FOR PROCEDURE:  Mr. Lastinger presented to me with complex fot ad ankle deformity.  His main issue was ankle arthrosis, but he did have symptomatic subtalar and talonavicular arthrosis.  Surgery was indicated for all sites.    DESCRIPTION OF PROCEDURE:  The patient was identified in the holding area.  The operative extremity was signed by the surgeon.  Intravenous antibiotics were given as prophylaxis.  He was taken to the operating suite and placed supine on the operative table.  All bony prominences were well padded.  Sterile prep and drape of the operative extremity was completed to the thigh.  The thigh tourniquet remained in place for 2 hours and 20 minutes, and then it was released.  The patient preoperatively had a CT scan done for the Inbone device for Prophecy guidance.  This was ready and available.  We began our surgery with a central incision overlying the anterior ankle, incision through skin and subcutaneous tissues, protecting neurovascular structures as they identified including the EHL tendon and the anterior tibial tendon.   Subperiosteal elevation of the ankle joint was completed.  Prophecy jigs were then used to select the positioning of the tibial cut.  The cut block was inserted.  During this time, we were confirmed under fluoroscopic imaging.  Tibial resection was made with a #4 size and chamfer cut for the hip screw and for the talar cut.  The talar cut was cut in much the same manner with the #3 cutting jig.  All bone was removed.  The surfaces were checked and prepared.  The gutters were relieved of any impingement and osteophytes.  Some deltoid and lateral ligament releases were completed to balance.  Once completed, we copiously irrigated and then inserted the coupler device.  The C clamp was inserted to gain access to the plantar incision up through into the intramedullary space predicted by the Prophecy system.  Reaming was done to 16-mm for press fit.  The implants were selected for 16 mm leading and midsections, and then an 18-mm base made into a #4 tibial baseplate which was prepped and then press fit into position.  We copiously irrigated again and prepared for the talus.  The talus was floated into position and confirmed under fluoroscopic imaging our desired position with the temporary poly.  We then inserted 3 pins to guide for the drilling of the peg  drills as well as for the central collared long-stemmed drill.  This was completed, and these implants were removed except for the tibial implant which was remaining press fit in place.  We now prepared for the subtalar fusion, pausing the total ankle portion.  Through the talus, we made an approach with 2 drill holes that corresponded to the Endo-Fuse rods placed just anterior to the talar stem and going across the posterior facet at a 90-degree angle.  Two 4 mm, 55 x 55-mm Endo-Fuse rods were selected and pushed into position to get a spot-welded fusion of the subtalar joint in situ.  Now, the talus was repositioned, going back to the total ankle press fit into  position.  We selected the correct poly insert and placed it over the jack screw, and 20 degrees of dorsiflexion and 40 degrees of plantarflexion were noted at the end of this portion of the procedure.  Anterior dissection distally was continued until we exposed the talonavicular joint.  Here, we identified the joint, debrided it aggressively and placed two 20-mm claw plates gaining access for compression across the talonavicular joint locked into position with our screws, we compressed the activator and were selected under fluoroscopic imaging to be in proper position.  All wounds were copiously irrigated and closed in layers.  Sterile bandages were applied.  The total ankle has been completed.  The talonavicular fusion and the subtalar fusion are now complete.  The wounds were closed in layers.  Sterile bandage and short leg splint were applied.  The patient was awakened and taken to the recovery room stable.      Heide Guile, MD  Assistant Professor  [facility] Department of Orthopaedics    [id number]; D: 11/05/2014 17:12:31; T: 11/05/2014 99:77:41

## 2014-11-05 NOTE — Nurses Notes (Signed)
Patient received from PACU RN.  VSS.  Pain 5/10 at this time. +CSM RLE.

## 2014-11-05 NOTE — Anesthesia Procedure Notes (Addendum)
Continuous Femoral Sciatic    Diagnosis: ankle/foot pain    Laterality: Right  Indication: post-op pain management      Ultrasound guidance for needle placement, imaging, supervision and interpretation.  Ultrasound image archived.    NOTES: [facility]  Anesthesiology Procedure Note    [name]  [id number]  11/05/2014       Pain Diagnosis: Pain in foot/ankle     719.47       Procedure:   Right Femoral nerve block, continuous block    64448--59 modifer, performed for postoperative pain mgt     62 modifer, performed on same extremity as sciatic nerve block     Realtime ultrasound used for needle placement, imaging and interpretation. 85909     Ultrasound image has been electronically archived.        Procedure:   Right Sciatic nerve block, continuous block    64446--59 modifer, performed for postoperative pain mgt       Approach:  transgluteal    Block requested by Dr. Irena Cords for postoperative pain management.    Description (femoral): The risks, benefits, and alternatives to the procedure were discussed. All questions were answered and informed consent was obtained.  Monitors were placed.  A time-out was preformed.  The patient was prepped and draped in sterile fashion.  Local infiltration was performed and an 18 gauge 2" inch insulated touhy needle was advanced under realtime ultrasound until motor stimulation of the femoral nerve occurred. The twitch faded at a current intensity of 0.5 mA. A total of 20 mL of 0.5% ropivacaine was injected incrementally with negative aspiration.  The solution injected easily with minimal resistance.  Low injection pressure was noted throughout.  The patient did not experience pain or paresthesias.  A 10 gauge polyamide catheter was inserted and secured with a sterile dressing.  The patient tolerated the procedure well with no complications.    Secondary tunneling performed -- no    Description (sciatic):  Description: The risks, benefits, and  alternatives to the procedure were discussed. All questions were answered and informed consent was obtained.  Monitors were placed.  A time-out was preformed.  The patient was prepped and draped in sterile fashion.  Local infiltration was performed and an 18 gauge 4" inch insulated touhy needle was advanced until motor stimulation of the sciatic nerve occurred. The twitch faded at a current intensity of 0.5 mA.  A total of 20 mL of 0.5% ropivacaine was injected incrementally with negative aspiration.  The solution injected easily with minimal resistance.  Low injection pressure was noted throughout.  The patient did not experience pain or paresthesias.  A 20 gauge polyamide catheter was inserted and secured with a sterile dressing.  The patient tolerated the procedure well with no complications.     Sedation: 2 mg midazolam and 100 mcg fentanyl were given intravenously for an appropriate degree of anxiolysis and analgesia.Marland Kitchen    Operators: Education officer, community (resident) / Redmond Pulling (staff)    Arletta Bale, MD 11/05/2014, 08:07  PGY-2 Dept of Anesthesiology  Pager [phone]              CRNA/Resident: Mannie Stabile  Anesthesiologist: Esther Hardy  I was present and supervised/observed the entire procedure.  Esther Hardy, MD 11/20/2014, 00:19

## 2014-11-05 NOTE — Anesthesia Transfer of Care (Signed)
ANESTHESIA TRANSFER OF CARE NOTE        Anesthesia Service      [facility]         Last Vitals: Temperature: 36.8 C (98.2 F) (11/05/14 1053)  Heart Rate: 94 (11/05/14 1053)  BP (Non-Invasive): (!) 156/100 mmHg (11/05/14 1053)  Respiratory Rate: 18 (11/05/14 1053)  SpO2-1: 97 % (11/05/14 1053)    Patient transferred to PACU in stable condition. Report given to RN.    1/18/2016at 10:53.

## 2014-11-05 NOTE — Nurses Notes (Addendum)
Patient admitted to 714 S/p Right Total Ankle on 11/05/14.  Nerve plexus x 2 infusing at 8 ml/hr.  Oriented to bed controls, call bell in reach. Patient alert and oriented x 4, instructed to call not fall. 1/2 cast to RLE is intact with scant bloody drainage.  MIVF maintained.  Patient resting comfortably watching Tv at this time.

## 2014-11-05 NOTE — Nurses Notes (Signed)
Notified orthopaedics regarding patient RLE dressing small amount of bloody drainage underneath ace wrap.

## 2014-11-06 LAB — H & H
HCT: 36.8 % (ref 36.7–47.0)
HCT: 36.8 % (ref 36.7–47.0)
HGB: 11.8 g/dL — ABNORMAL LOW (ref 12.5–16.3)

## 2014-11-06 MED ORDER — FAMOTIDINE 20 MG TABLET
20.0000 mg | ORAL_TABLET | Freq: Two times a day (BID) | ORAL | Status: DC
Start: 2014-11-06 — End: 2014-11-06
  Administered 2014-11-06: 20 mg via ORAL
  Filled 2014-11-06 (×2): qty 1

## 2014-11-06 MED ADMIN — sodium chloride 0.9 % intravenous solution: ORAL | @ 03:00:00

## 2014-11-06 MED ADMIN — vitamin A and D topical ointment: ORAL | @ 08:00:00 | NDC 00168003545

## 2014-11-06 MED ADMIN — palonosetron 0.25 mg/5 mL intravenous solution: ORAL | @ 09:00:00

## 2014-11-06 NOTE — Care Plan (Signed)
Problem: General Plan of Care(Adult,OB)  Goal: Plan of Care Review(Adult,OB)  The patient and/or their representative will communicate an understanding of their plan of care   Outcome: Ongoing (see interventions/notes)  Pt alert and oriented and able to make needs known. Pain managed with prn dilaudid and prn percocet. Up with assist x1. Plan of care reviewed with patient, verbalized understanding.

## 2014-11-06 NOTE — PT Evaluation (Signed)
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 male POD 1 s/p L R TAA. PT is consulted for evaluation, treatment, and assessment of home safety/post discharge rehabilitation need.    Precautions: NWB RLE     Past Medical History   Diagnosis Date    Difficult intubation     Sleep apnea     CPAP (continuous positive airway pressure) dependence      setting 13    Essential hypertension     Hyperlipidemia     Rash      rle, told he had psorasis, seen derm, given cream    Esophageal reflux          Past Surgical History   Procedure Laterality Date    Hx other       right ankle x3    Hx knee surgery       right    Hx tonsillectomy            reports that he has never smoked. He quit smokeless tobacco use about 6 weeks ago. His smokeless tobacco use included Snuff. He reports that he drinks about 7.2 oz of alcohol per week. He reports that he does not use illicit drugs.  History   Smoking status    Never Smoker    Smokeless tobacco    Former Systems developer    Types: Snuff    Quit date: 09/23/2014         Subjective:   Flowsheet Info:      11/06/14 1042   Mutuality/Individual Preferences   Patient Specific Interventions OOB to chair TID, elevated RLE   Living Environment   Lives With spouse   Living Arrangements house   Functional Level Prior   Ambulation 0-->independent   Transferring 0-->independent   Prior Functional Level Comment has used crutches in the past   Self-Care   Equipment Currently Used at Home other (see comments)  (knee scooter)   Pain Assessment   Pain Scale: Numbers, Pretreatment 5/10   Pain Scale:  Numbers, PostTreatment 6/10       Patient Goals: return home    Pt is pleasant and motivated. His plan is to use a knee scooter, and crutches for getting in and out of the house. 3 STE, 1 level inside. Has hand rail L ascending      Objective:     Cognition: Alert, Awake, Cooperative and Oriented Person, Place and Time  Follows Directions: Simple  and Complex      ROM  Left Right   Shoulder  [facility]  [facility]    Elbow  WFL  WFL    Wrist/Hand  [facility]  [facility]    Hip  [facility]  [facility]    Knee  [facility]  [facility]    Ankle  WFL NT       STRENGTH  Left Right   Shoulder  [facility]   Elbow  WFL WFL   Wrist/Hand  [facility]   Hip Flexion [facility]  [facility] WFL   Knee Flexion WFL WFL    Extension [facility] WFL   Ankle DF WFL NT    PF WFL NT       Bed Mobility: rolling, scooting supervision  Transfers: supervision for all transfers  Balance: Sitting Dynamic Fair + Standing Dynamic Fair +  Ambulation: 50 ft with FWW and 50 ft with crutches using good NWB RLE technique.  Stairs: Up/down at least 4 steps with hand rail and crutch with SBA maintaining wb precautions  Posture: WFL  Other: Pt resting comfortably in chair post treatment with call button in reach, no further needs expressed. CA changing bed linens for pt.         Assessment:   Mr Junious participate well in this evaluation and is transferring, ambulating, and going up and down stairs adequately for return home with knee walker and crutches.     Activity Recommendation: OOB to chair TID, keep RLE elevated      Discharge Needs:   Disposition Recommendation: home with assist, outpatient PT at the appropriate phase of healing  Equipment Recommendation: crutches issued. Has knee walker. (Necessary for accomplishing ADLs/basic life tasks such as use of the restroom, access to self care items, mobility in the home, and participation in social and community activities. Pt demonstrates safe and effective use of the device.)          Rehab Potential: Good    Problem List: Decreased Ambulation, Decreased Balance,  Decreased Endurance, Decreased ROM, Decreased Strength, Decreased Transfers, Pain  and Safety  Barriers to Discharge: none    JUSTIFICATION OF DISCHARGE RECOMMENDATION  Based on current diagnosis, functional performance prior to admission, and current  functional performance, this patient requires continued PT services   in Outpatient Therapy in order to achieve significant functional improvements in these   deficit areas: Balance, Endurance, Gait, Strength and ROM.           Goals:   By D/C:  1. Pt will demonstrate all bed mobility skills with mod I  2. Pt will demonstrate all transfers with mod I  3. Pt will ambulate at least 250 ft with crutches mod I         Plan:   Current Intervention:Therapeutic Exercise and Mobility Training   To provide physical therapy services 1 times/day for 1-3 days/week until discontinued.    The risks/benefits of therapy have been discussed with the patient/caregiver and he/she is in agreement with the established plan of care.     Therapist:   Erma Pinto, PT 11/06/2014 10:44  Pager #: [phone]  Evaluation Time: 53 minutes

## 2014-11-06 NOTE — Care Plan (Signed)
Problem: General Plan of Care(Adult,OB)  Goal: Plan of Care Review(Adult,OB)  The patient and/or their representative will communicate an understanding of their plan of care   Outcome: Ongoing (see interventions/notes)    Physical Therapy Care Plan Note:    Mr Coad participate well in this evaluation and is transferring, ambulating, and going up and down stairs adequately for return home with knee walker and crutches.     Activity Recommendation: OOB to chair TID, keep RLE elevated        Discharge Needs:   Disposition Recommendation: home with assist, outpatient PT at the appropriate phase of healing  Equipment Recommendation: crutches issued. Has knee walker. (Necessary for accomplishing ADLs/basic life tasks such as use of the restroom, access to self care items, mobility in the home, and participation in social and community activities. Pt demonstrates safe and effective use of the device.)      Shanon Brow A. Rozann Lesches PT, DPT 11/06/2014 10:53  Pager Number: [phone]

## 2014-11-06 NOTE — Progress Notes (Signed)
[facility]  ACUTE PAIN SERVICE PROGRESS NOTE    DATE OF SERVICE:  11/06/2014  DIAGNOSIS:  Ankle surgery      VITALS:   Temperature: 36.8 C (98.2 F)  Heart Rate: 96  BP (Non-Invasive): (!) 147/72 mmHg (RN notified)  Respiratory Rate: 20  SpO2-1: 90 %  Pain Score (Numeric, Faces): 7    MEDICATION:  0.2% ropi               INFUSION RATE:  77ml/hr sciatic, 43ml/hr femoral    CATHETER SITE:  femoral and sciatic    SITE ASSESSMENT:  clean, dry and intact    SUBJECTIVE:  Pt reports reasonable pain control this am, did have significant pain overnight    PLAN:  Catheters removed with blue tip intact, will sign off, please call with any further questions or concerns        Christiana Fuchs, MD  11/06/2014, 08:20

## 2014-11-06 NOTE — Pharmacy (Addendum)
[place]  Discharge Pharmacy Service  Initial Note    Patient: [name],[name]  Date of Birth: April 01, 1966  MRN: [id number]  Room/Bed: 7NE/14  Service: ORTHOPAEDICS    Date of Service: 11/06/2014    No Known Allergies    Reason for Visit:    Initiate Discharge Pharmacy Services:  Accepted  Freedom of Choice Offerings: Accepted  Review of Patient's Medication List: Accepted  Initiate Counseling Offer:  yes    Conclusion:     Consulted patient about the Discharge Pharmacy Service and patient accepted. Please contact Discharge Pharmacy when discharge orders are made and prescriptions are written at [phone] or fax prescriptions or [phone]. (If orders change or are discontinued please contact discharge Pharmacy immediately.)     Lyndee Leo, RX STUDENT 11/06/2014, 10:09

## 2014-11-06 NOTE — Care Plan (Signed)
Problem: General Plan of Care(Adult,OB)  Goal: Plan of Care Review(Adult,OB)  The patient and/or their representative will communicate an understanding of their plan of care   Outcome: Ongoing (see interventions/notes)  NWB RLE x 6 weeks.  Maintain splint until f/u appt w/ Dr. Irena Cords.  Transitioning to PO meds for pain management.  DVT prophylaxis with ASA $Remove'325mg'ZrbUrno$ .    Discharge Plan:  Home (Patient/Family Member/other) (code 1)  Met with pt and spouse, Amy, per physician consult for discharge planning.  Assessment as noted.  Amy is supportive and will be able to provide transportation and physical assistance.  No DME needs currently identified.  Anticipating discharge 11/07/14.

## 2014-11-06 NOTE — Care Plan (Signed)
Problem: General Plan of Care(Adult,OB)  Goal: Plan of Care Review(Adult,OB)  The patient and/or their representative will communicate an understanding of their plan of care   Outcome: Ongoing (see interventions/notes)  Pt's pain managed with PRN Percocet and Dilaudid. Pt has been able to rest adequate amounts through the night. Pt has slight numbness and tingling present in RLE and has 2 Ropivacaine nerve plexus infusing at 8 ml/hr via the sciatic and femoral nerve. 2/1 pulses present, warm skin to touch, and can wiggle all toes. Ace and splint intact to RLE and clean. +1 edema to right toes. Pt tolerating IV fluids and IV abx. Discharge planning in progress. Point of care reviewed with pt.     Problem: Infection, Risk/Actual (Adult)  Goal: Identify Related Risk Factors and Signs and Symptoms  Related risk factors and signs and symptoms are identified upon initiation of Human Response Clinical Practice Guideline (CPG)   Outcome: Ongoing (see interventions/notes)  Goal: Infection Prevention/Resolution  Patient will demonstrate the desired outcomes by discharge/transition of care.   Outcome: Ongoing (see interventions/notes)    Problem: Fall Risk (Adult)  Goal: Identify Related Risk Factors and Signs and Symptoms  Related risk factors and signs and symptoms are identified upon initiation of Human Response Clinical Practice Guideline (CPG)   Outcome: Ongoing (see interventions/notes)  Goal: Absence of Falls  Patient will demonstrate the desired outcomes by discharge/transition of care.   Outcome: Ongoing (see interventions/notes)

## 2014-11-06 NOTE — Care Management Notes (Signed)
[place] Management Initial Evaluation    Patient Name: [name]  Date of Birth: 1966-06-05  Sex: male  Date/Time of Admission: 11/05/2014  4:56 AM  Room/Bed: 7NE/14  Payor: BLUE CROSS BLUE SHIELD / Plan: [place] BC/BS PPO / Product Type: PPO /   PCP: Brandon Melnick, MD    Pharmacy Info:   Preferred Pharmacy     [id number], [facility] - [address]    [address] [place] [address]    Phone: [phone] Fax: [phone]    Open 24 Hours?: No    [id number] [place]    [place] [address]    Phone: [phone] Fax: [phone]    Open 24 Hours?: No        Emergency Contact Info:   Extended Emergency Contact Information  Primary Emergency Contact: Fite,AMY   [place] Phone: [phone]  Relation: Wife    History:   [name] is a 73 y.o., male, admitted s/p right total ankle arthroplasty, right subtalar fusion and right talonavicular fusion 11/05/14.    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]    Assessment:      11/06/14 0950   Assessment Details   Assessment Type Admission   Date of Care Management Update 11/06/14   Date of Next DCP Update 11/09/14   Care Management Plan   Discharge Planning Status initial meeting   Projected Discharge Date 11/07/14   CM will evaluate for rehabilitation potential yes   Discharge Needs Assessment   Equipment Currently Used at Home (knee scooter)   Equipment Needed After Discharge (TBD following PT/OT eval.)   Discharge Facility/Level Of Care Needs Home (Patient/Family Member/other)(code 1)   Transportation Available family or friend will provide   Referral Information   Admission Type inpatient   Address Verified verified-no changes   Arrived From admitted as an inpatient   Insurance Verified verified-no change   ADVANCE DIRECTIVES      Does the Patient have an Advance Directive? Yes, Patient Does Have Advance Directive for Healthcare Treatment   Type of Advance Directive Completed Medical Living Will;Medical Power of Attorney   Copy of Advance Directives in Chart? 0   Name of MPOA or Healthcare Surrogate Amy Dieguez, spouse   Patient Requests Assistance in Having Advance Directive Notarized. N/A   Employment/Financial   Patient has Prescription Coverage?  Yes   Financial Concerns none   Living Environment   Select an age group to open "lives with" row.  Adult   Lives With spouse   Living Arrangements house   Able to Return to Prior Living Arrangements yes   Home Safety   Home Assessment: No Problems Identified   Home Accessibility no concerns     NWB RLE x 6 weeks.  Maintain splint until f/u appt w/ Dr. Irena Cords.  Transitioning to PO meds for pain management.  DVT prophylaxis with ASA $Remove'325mg'qEbTRij$ .    Discharge Plan:  Home (Patient/Family Member/other) (code 1)  Met with pt and spouse, Amy, per physician consult for discharge planning.  Assessment as noted.  Amy is supportive and will be able to provide transportation and physical assistance.  No DME needs currently identified.  Anticipating discharge 11/07/14.    The patient  will continue to be evaluated for developing discharge needs.     Case Manager: Hollice Espy 11/06/2014, 10:29  Phone: [phone]

## 2014-11-06 NOTE — Progress Notes (Addendum)
Orthopaedic Progress Note    Admit Date:11/05/2014  Date: 11/06/2014    Days Post op: 11/05/14  Operation: R total ankle arthroplasty; R subtalar fusion; R talonavicular fusion       Subjective: States he is doing ok. Some minor serosanguineous drainage from posterior aspect of splint overnight, states he "broke a sweat" overnight. Reports significant pain yesterday although it is improving.     Objective:  Vitals:   Filed Vitals:    11/05/14 1951 11/05/14 2359 11/06/14 0008 11/06/14 0355   BP:  150/88  147/72   Pulse:  101  96   Temp:  36.9 C (98.4 F)  36.8 C (98.2 F)   Resp:  20  20   SpO2: 92% 93% 92% 90%     Hgb 11/06/14 = 11.8    NAD, resting comfortably in bed - calm, talkative, not diaphoretic  Breathing non-labored  Minimal serosanguineous drainage from posterior of heel   SILT over dorsum of exposed toes in splint and 1st web space  BCR toes  Able to move toes on command    Assessment:  Pt is a 49 y.o. male s/p R total ankle arthroplasty, R subtalar fusion, R talonavicular fusion    Plan:  Pain control - attempt to transition to PO meds only  DVT ppx with ASA 325mg   Maintain splint until f/u appt w/ Dr. Irena Cords  NWB RLE  24 abx complete today  PT/OT ordered  Discharge likely today or tomorrow    Sharmon Revere, MD  11/06/2014, 06:37  PGY-1 [place]  PAGER # [phone]            I counseled with the resident by phone, and/or by computer access to the patient's chart. We agreed on the treatment plan.  I reviewed the resident's note.  I agree with the findings and plan of care as documented in the resident's note.    Fulton Reek, MD  Assistant Professor  Chief of Foot & Ankle Surgery  Department of Orthopaedics  [place] [facility]  Operated by [employer]  11/06/2014, 07:01

## 2014-11-06 NOTE — Anesthesia Postprocedure Evaluation (Signed)
ANESTHESIA POSTOP EVALUATION NOTE        Anesthesia Service      [facility]     11/06/2014     Last Vitals: Temperature: 36.8 C (98.2 F) (11/06/14 0355)  Heart Rate: 96 (11/06/14 0355)  BP (Non-Invasive): (!) 147/72 mmHg (RN notified) (11/06/14 0355)  Respiratory Rate: 20 (11/06/14 0556)  SpO2-1: 90 % (11/06/14 0355)  Pain Score (Numeric, Faces): 7 (11/06/14 0236)    Procedure(s):  ARTHROPLASTY ANKLE TOTAL  BLOCK PRE-OP SCIATIC AND CATHETER    Patient is sufficiently recovered from the effects of anesthesia to participate in the evaluation and has returned to their pre-procedure level.  I have reviewed and evaluated the following:  Respiratory Function: Consistent with pre anesthetic level  Cardiovascular Function: Consistent with pre anesthetic level  Mental Status: Return to pre anesthetic baseline level  Pain: Sufficiently controlled with medication  Nausea and Vomiting: Absent or sufficiently controlled with medication  Post-op Anesthetic Complications: None    Comment/ re-evaluation for any variations: None

## 2014-11-06 NOTE — Nurses Notes (Signed)
Patient to be discharged to home via car, wife driving. IV removed. Education provided on pain management and fall risk. Patient verbalized understanding. Discharge instructions provided verbally and in written form. Patient verbalized understanding.

## 2014-11-07 ENCOUNTER — Encounter (INDEPENDENT_AMBULATORY_CARE_PROVIDER_SITE_OTHER): Payer: Self-pay

## 2014-11-07 NOTE — Discharge Summary (Addendum)
DISCHARGE SUMMARY      PATIENT NAME:  [name],[name]  MRN:  [id number]  DOB:  31-Oct-1965    ADMISSION DATE:  11/05/2014  DISCHARGE DATE:  11/06/14    ATTENDING PHYSICIAN: Chanika Byland  PRIMARY CARE PHYSICIAN: Brandon Melnick, MD     DISCHARGE DIAGNOSIS:   Principle Problem: s/p Total Ankle Arthroplasty  Active Hospital Problems    Diagnosis Date Noted   . Ankle arthritis 11/05/2014      Resolved Hospital Problems    Diagnosis    No resolved problems to display.          No Known Allergies  DISCHARGE MEDICATIONS:     Current Discharge Medication List      START taking these medications.       Details    aspirin 325 mg Tablet, Delayed Release (E.C.)   Commonly known as:  ECOTRIN    325 mg, Oral, DAILY   Qty:  42 Tab   Refills:  0       oxyCODONE-acetaminophen 5-325 mg Tablet   Commonly known as:  PERCOCET    1 Tab, Oral, EVERY 4 HOURS PRN   Qty:  80 Tab   Refills:  0       sennosides-docusate sodium 8.6-50 mg Tablet   Commonly known as:  SENOKOT-S    1 Tab, Oral, 2 TIMES DAILY   Qty:  60 Tab   Refills:  0         CONTINUE these medications - NO CHANGES were made during your visit.       Details    atorvastatin 10 mg Tablet   Commonly known as:  LIPITOR    10 mg, Oral, DAILY   Refills:  0       AZOR ORAL    Oral   Refills:  0       colchicine 0.6 mg Tablet    0.6 mg, Oral, DAILY   Refills:  0       esomeprazole magnesium 20 mg Capsule, Delayed Release(E.C.)   Commonly known as:  NEXIUM    20 mg, Oral, EVERY MORNING BEFORE BREAKFAST   Refills:  0           DISCHARGE INSTRUCTIONS:        Follow-up Information     Follow up with [facility]    Specialty:  Orthopaedics    Contact information:    [place]  [phone]    Additional information:    For driving directions to the [facility] in [place], please call [phone] or [phone]. You may also visit our website at [email or web address]*Valet parking is available to patients at [place] outpatient clinics for free and tipping is not required.*Visitors to our main campus will Location manager as we are expanding to better serve you. We apologize for any inconvenience this may cause and appreciate your patience.          DISCHARGE INSTRUCTION - MISC   Keep splint clean/dry.  Do not remove.  You are not to bear weight on the operative leg for 6 weeks.  Take a daily aspirin 325mg  to prevent blood clot formation.     SCHEDULE FOLLOW-UP [facility] (P[facility])   Follow-up in: OTHER: (specify in comments) 11/23/14   Reason for visit: POST-OP VISIT    Followup reason: Total ankle post op  REASON FOR HOSPITALIZATION AND HOSPITAL COURSE:  This is a 49 y.o., male who presented for a total ankle arthroplasty with subtalar and talonavicular fusions.  He tolerated the procedure well and was admitted to the floor post operatively.  He received 24 hrs of antibiotics.  He was placed on ASA for DVT prophylaxis.  He worked with PT who cleared him for home discharge.  He will be non weight bearing for 6 weeks.    CONDITION ON DISCHARGE:  A. Ambulation: Ambulation with assistive device  B. Self-care Ability: With partial assistance  C. Cognitive Status Alert  D. DNR status at discharge: Full Code    DISCHARGE DISPOSITION:  Home discharge                Harriett Sineavid Benjamin McConda, MD        Copies sent to Care Team       Relationship Specialty Notifications Start End    Mancel Parsonsar, Paul, MD PCP - General EXTERNAL  08/16/14     Phone: [phone] Fax: [phone]         [address] [facility] [id number]          Referring providers can utilize [email or web address] to access their referred [facility] patient's information.                I counseled with the resident by phone, and/or by computer access to the patient's chart. We agreed on the treatment plan.  I reviewed the resident's note.  I agree with the findings and plan of care as documented in the  resident's note.    Tarry Kosobert Dale Glendi Mohiuddin, MD  Assistant Professor  Chief of Foot & Ankle Surgery  Department of Orthopaedics  [facility] [employer]  Operated by [facility]  11/08/2014, 07:09

## 2014-11-23 ENCOUNTER — Encounter (INDEPENDENT_AMBULATORY_CARE_PROVIDER_SITE_OTHER): Payer: Self-pay | Admitting: Family Medicine

## 2014-11-26 ENCOUNTER — Ambulatory Visit: Payer: BC Managed Care – PPO | Admitting: Family Medicine

## 2014-11-26 ENCOUNTER — Encounter (INDEPENDENT_AMBULATORY_CARE_PROVIDER_SITE_OTHER): Payer: BC Managed Care – PPO | Admitting: Family Medicine

## 2014-11-26 ENCOUNTER — Encounter (INDEPENDENT_AMBULATORY_CARE_PROVIDER_SITE_OTHER): Payer: Self-pay | Admitting: Family Medicine

## 2014-11-26 DIAGNOSIS — Z4789 Encounter for other orthopedic aftercare: Secondary | ICD-10-CM | POA: Insufficient documentation

## 2014-11-26 DIAGNOSIS — M19079 Primary osteoarthritis, unspecified ankle and foot: Secondary | ICD-10-CM

## 2014-11-26 DIAGNOSIS — M129 Arthropathy, unspecified: Secondary | ICD-10-CM

## 2014-11-26 DIAGNOSIS — Z4802 Encounter for removal of sutures: Secondary | ICD-10-CM | POA: Insufficient documentation

## 2014-11-26 NOTE — Progress Notes (Addendum)
[place], [place] [address]                                PATIENT NAME: [name], [name]  HOSPITAL JHERDE:[id number]  DATE OF SERVICE:11/26/2014  DATE OF BIRTH: [birth year]    PROGRESS NOTE    SUBJECTIVE:  [name] is here today for his first postop visit.  He had undergone a right total ankle arthroplasty with right subtalar fusion and talonavicular fusion on November 05, 2014.  He has been nonweightbearing as directed.  He has no complaints at today's office visit.  He states that his pain is controlled.    OBJECTIVE:  The patient is sitting comfortably in the cast room.   Vital signs were not obtained.  On exam of his right lower extremity, his incisions are well-healed.  There are no signs of infection.  His swelling is well controlled.  He has brisk capillary refill, 2+ distal pulses are present.    ASSESSMENT:  Status post right total ankle arthroplasty with right subtalar fusion and talonavicular fusion done on November 05, 2014.    PLAN:  Sutures were removed at today's office visit.  He was placed in a Cam boot to allow to him to do ankle range of motion at home, but he was instructed that he must remain nonweightbearing for an additional 3 weeks due to the subtalar fusion.  We will follow up with him in 3 weeks.  That will be for 6 weeks from his surgery, weightbearing x-rays of his ankle will be obtained at that time.    The patient was seen independently by myself.      Lillie Columbia, PA-C  [place] Department of Orthopaedics    Inda Coke, MD  Assistant Professor  [place] Department of Orthopaedics    [id number]; D: 11/26/2014 08:57:40; T: 11/26/2014 13:14:49                 Fulton Reek, MD  Assistant Professor  Chief of Foot & Ankle Surgery  [facility] of [place] [facility]  Operated by [employer]  11/27/2014, 09:52

## 2014-12-18 ENCOUNTER — Ambulatory Visit (HOSPITAL_BASED_OUTPATIENT_CLINIC_OR_DEPARTMENT_OTHER): Payer: BC Managed Care – PPO

## 2014-12-18 ENCOUNTER — Encounter (INDEPENDENT_AMBULATORY_CARE_PROVIDER_SITE_OTHER): Payer: Self-pay

## 2014-12-18 ENCOUNTER — Ambulatory Visit
Admission: RE | Admit: 2014-12-18 | Discharge: 2014-12-18 | Disposition: A | Discharge status: Home / Self Care | Payer: BC Managed Care – PPO | Source: Ambulatory Visit

## 2014-12-18 DIAGNOSIS — M129 Arthropathy, unspecified: Secondary | ICD-10-CM

## 2014-12-18 DIAGNOSIS — M19079 Primary osteoarthritis, unspecified ankle and foot: Secondary | ICD-10-CM

## 2014-12-18 DIAGNOSIS — Z9889 Other specified postprocedural states: Secondary | ICD-10-CM

## 2014-12-18 DIAGNOSIS — Z96661 Presence of right artificial ankle joint: Secondary | ICD-10-CM

## 2014-12-18 NOTE — Progress Notes (Addendum)
[place], [place] [address]                                PATIENT NAME: [name], [name]  HOSPITAL GEXBMW:[id number]  DATE OF SERVICE:12/18/2014  DATE OF BIRTH: 22-May-1966    PROGRESS NOTE    DATE OF SURGERY:  November 05, 2014.    SUBJECTIVE:  Mr. Deman is a 49 year old male status post right total ankle arthroplasty with right subtalar fusion and talonavicular fusion on November 05, 2014.  The patient is doing very well.  He has been nonweightbearing as instructed.  He is ready to increase activity.  He denies any fever, chills or night sweats.  He does not report any pain and he is not taking any pain medications.  He does have some tingling around the wound sites.  He denies any drainage.  He does report that the big toe has limited motion.  No other complaints today.    OBJECTIVE:  The patient is no acute distress.  Head is normocephalic, atraumatic.  His mood is appropriate for the clinical situation.  Examination of the right lower extremity:  The boot was removed.  The incision is well healed without signs of drainage.  There is some edema to the foot.  Range of motion of the ankle:  Plantar flexion 20 degrees, dorsiflexion 0 degrees.  Sensation is intact to light touch over the dorsal and plantar aspects of the foot.  Brisk cap refill to all toes.  Further evaluation of the great toe shows a weak FHL when isolated against resistance.  Good strength with EHL.    IMAGING:  X-rays done in clinic today reviewed with Dr. Irena Cords on PACS and show a total ankle with subtalar fusion and talonavicular fusion.  There is no evidence of hardware failure or lucencies.    ASSESSMENT:  A 49 year old male status post right total ankle arthroplasty with right subtalar fusion and talonavicular fusion done on November 05, 2014.    PLAN:  The patient is doing very well.  We are going  to allow him to progress to weightbearing as tolerated.  We would like him to continue the boot when walking greater than 5 minutes.  However, when he is walking around the house and he is not going to be up very long, he can walk without that boot.  We are going to let him start exercising, swimming or bicycling without the boot.  However, if he is doing anything that involves bearing weight he is to wear the boot.  We discussed with him the great toe FHL tendon might likely be irritated from the hardware.  This is not a big deal if he does not gain full function as oftentimes this tendon is harvested for other procedures and sacrificed.  We are going to hold off on ordering him physical therapy at this time and reevaluate him when we see him back.  The patient is to return to clinic in 1 month.  The patient agrees with the plan.  All questions were answered.  Carmina Miller, MD  Resident  [place] Department of Orthopaedics    Inda Coke, MD  Assistant Professor  [facility] Department of Orthopaedics    [id number]; D: 12/18/2014 83:29:19; T: 12/18/2014 16:60:60                 I saw and examined the patient.  I directly supervised the resident's activities and procedures.  I reviewed the resident's note.  I agree with the findings and plan of care as documented in the resident's note.    Fulton Reek, MD  Assistant Professor  Chief of Foot & Ankle Surgery  Department of Orthopaedics  [place] [facility]  Operated by [employer]  12/19/2014, 13:24

## 2015-01-22 ENCOUNTER — Encounter (INDEPENDENT_AMBULATORY_CARE_PROVIDER_SITE_OTHER): Payer: Self-pay

## 2015-01-22 ENCOUNTER — Ambulatory Visit
Admission: RE | Admit: 2015-01-22 | Discharge: 2015-01-22 | Disposition: A | Discharge status: Home / Self Care | Payer: BC Managed Care – PPO | Source: Ambulatory Visit

## 2015-01-22 ENCOUNTER — Ambulatory Visit (HOSPITAL_BASED_OUTPATIENT_CLINIC_OR_DEPARTMENT_OTHER): Payer: BC Managed Care – PPO

## 2015-01-22 DIAGNOSIS — Z96661 Presence of right artificial ankle joint: Secondary | ICD-10-CM | POA: Insufficient documentation

## 2015-01-22 DIAGNOSIS — Z981 Arthrodesis status: Secondary | ICD-10-CM | POA: Insufficient documentation

## 2015-01-22 DIAGNOSIS — M25579 Pain in unspecified ankle and joints of unspecified foot: Secondary | ICD-10-CM

## 2015-01-22 DIAGNOSIS — Z9889 Other specified postprocedural states: Secondary | ICD-10-CM

## 2015-01-22 DIAGNOSIS — M25471 Effusion, right ankle: Secondary | ICD-10-CM

## 2015-01-22 DIAGNOSIS — M85871 Other specified disorders of bone density and structure, right ankle and foot: Secondary | ICD-10-CM

## 2015-01-22 DIAGNOSIS — Z471 Aftercare following joint replacement surgery: Secondary | ICD-10-CM | POA: Insufficient documentation

## 2015-01-22 NOTE — Progress Notes (Addendum)
[place], [place] [address]                                PATIENT NAME: [name], [name]  HOSPITAL JSEGBT:[id number]  DATE OF SERVICE:01/22/2015  DATE OF BIRTH: 16-Mar-1966    PROGRESS NOTE    DATE OF SURGERY:  November 05, 2014, right total ankle arthroplasty with right subtalar and talonavicular fusion.    SUBJECTIVE:  Mr. Tuccillo is a 49 year old man who presents to Dr. Drucilla Chalet clinic for a followup visit of a right total ankle arthroplasty with right subtalar and talonavicular fusions done on November 05, 2014.  He has been doing well since we last saw him.  He has continued wear his boot when he is up on the ankle for more than 5 minutes.  He does notice some pain with activity.  He has also had some swelling in the extremity for which he has been using his compression sock for.  Otherwise, he denies any acute issues today.    OBJECTIVE:  On exam, he is a well-appearing male in no acute distress.  Examination of the right ankle demonstrates the incisions over the anterior and medial aspects as well as the plantar foot and ankle are well healed without any evidence of erythema, drainage or swelling to suggest any signs of infection.  He has a trace amount of edema from the mid-calf down to the foot.  He has plantarflexion of about 20 degrees and dorsiflexion to 0.  His ankle dorsiflexion increases a few degrees when his knee is flexed. His sensation is intact to light touch in the saphenous, sural, superficial, deep peroneal and tibial nerve distributions.  He has 2+ dorsalis pedis and posterior tibial pulse.      IMAGING:  Radiographic imaging of the patient's right ankle was obtained and reviewed in the PACS system by myself and by Dr. Irena Cords.  The components from the right ankle arthroplasty appear to be in good alignment.  The subtalar and talonavicular joints  appear to be fusing.  All the hardware appears to be intact.    ASSESSMENT:  A 49 year old man status post right total ankle arthroplasty with right subtalar and talonavicular fusions.    PLAN:  We discussed with the patient today that he can go ahead and discontinue the use of his boot.  He can return to all of his normal activities without any restrictions as tolerated.  We advised him that he could have swelling for another 3 months or more in that foot and ankle.  He should continue to use his compression sock to help with this. At this time we will defer any physical therapy for him.  We gave him a sheet of instructions to work on calf stretching that he can do at home. We will see him back in about 3 months and at that time obtain repeat weightbearing radiograph imaging of the patient's right ankle.  If he has any further questions, he is more than welcome to give [place] a call here at the office.  Otherwise,  we will see him back as above.        Guss Bunde, MD  Resident  [place] Department of Orthopaedics    Inda Coke, MD  Assistant Professor  [facility] Department of Orthopaedics    [id number]; D: 01/22/2015 10:51:46; T: 01/22/2015 23:42:33                 I saw and examined the patient.  I directly supervised the resident's activities and procedures.  I reviewed the resident's note.  I agree with the findings and plan of care as documented in the resident's note.    Fulton Reek, MD  Assistant Professor  Chief of Foot & Ankle Surgery  Department of Orthopaedics  [place] [facility]  Operated by [employer]  01/28/2015, 15:22

## 2015-05-09 ENCOUNTER — Ambulatory Visit (INDEPENDENT_AMBULATORY_CARE_PROVIDER_SITE_OTHER): Payer: Self-pay

## 2015-05-09 ENCOUNTER — Ambulatory Visit (INDEPENDENT_AMBULATORY_CARE_PROVIDER_SITE_OTHER): Payer: BC Managed Care – PPO

## 2015-05-09 ENCOUNTER — Ambulatory Visit
Admission: RE | Admit: 2015-05-09 | Discharge: 2015-05-09 | Disposition: A | Discharge status: Home / Self Care | Payer: BC Managed Care – PPO | Source: Ambulatory Visit

## 2015-05-09 DIAGNOSIS — M25471 Effusion, right ankle: Secondary | ICD-10-CM | POA: Insufficient documentation

## 2015-05-09 DIAGNOSIS — M19071 Primary osteoarthritis, right ankle and foot: Secondary | ICD-10-CM

## 2015-05-09 DIAGNOSIS — Z471 Aftercare following joint replacement surgery: Secondary | ICD-10-CM | POA: Insufficient documentation

## 2015-05-09 DIAGNOSIS — Z09 Encounter for follow-up examination after completed treatment for conditions other than malignant neoplasm: Secondary | ICD-10-CM

## 2015-05-09 DIAGNOSIS — M779 Enthesopathy, unspecified: Secondary | ICD-10-CM | POA: Insufficient documentation

## 2015-05-09 DIAGNOSIS — Z981 Arthrodesis status: Secondary | ICD-10-CM | POA: Insufficient documentation

## 2015-05-09 DIAGNOSIS — Z96661 Presence of right artificial ankle joint: Secondary | ICD-10-CM

## 2015-05-09 NOTE — Progress Notes (Addendum)
[place], [place] [address]                                PATIENT NAME: [name], [name] [id number]  DATE OF SERVICE:05/09/2015  DATE OF BIRTH: [birth year]    PROGRESS NOTE    DATE OF SURGERY:  November 05, 2014, right total ankle arthroplasty with right subtalar and talonavicular fusion.    SUBJECTIVE:  [name] is a 49 year old male who comes in today now 6 months out from the above surgery.  Overall, the patient is very pleased with the outcome of the surgery and states that the pain is "about 75% reduced from preop."  The patient, however, was at beach last week and was unable to use his compression stocking there and, therefore, had an increase in pain and swelling on the medial and lateral sides of the ankle with a small area of very superficial wound dehiscence, but this cleared up without any problem, although he is still left with some residual pain, therefore, comes in today for routine followup.    OBJECTIVE:  Healthy-appearing male in no acute distress.  Examination of the right ankle shows that the patient has wounds that are healing well without any erythema or signs of infection.  He is focally tender to palpation along the peroneals as well as the posterior tibia.  He does not have as much motion as we would like.  We can get him to neutral and he has 15 degrees of plantarflexion.  Sensation intact to light touch in the forefoot with warm and well-perfused foot.  He has a mild amount of swelling.    IMAGING:  X-rays of the right ankle were obtained at today's visit.  Components of the right ankle arthroplasty appear to be in good alignment.  Subtalar and talonavicular joints are fused.    ASSESSMENT AND PLAN:  A  49 year old man status post right total ankle arthroplasty with right subtalar and talonavicular fusions.    PLAN:  At this  time, the patient is doing very well but given his swelling and tenderness and tendinitis, we would like to send him to physical therapy to work on modalities including phonophoresis massage.  We also explained to him contrast baths to help the swelling and pain.  The patient states he will try these and really just needs to come back for his 1-year followup here in 6 months.  He will call with any problems before then.      Garner Nash, MD  Resident  [place] Department of Orthopaedics    Inda Coke, MD  Assistant Professor  [facility] Department of Orthopaedics    [id number]; D: 05/09/2015 08:25:01; T: 05/09/2015 21:03:18                 I saw and examined the patient.  I directly supervised the resident's activities and procedures.  I reviewed the resident's note.  I agree with the findings and plan of care as documented in the resident's note.    Fulton Reek, MD  Assistant Professor  Chief of [place] Ankle Surgery  Department of [place] [facility]  05/31/2015, 17:40

## 2015-12-01 ENCOUNTER — Other Ambulatory Visit: Payer: Self-pay

## 2016-02-18 ENCOUNTER — Ambulatory Visit
Admission: RE | Admit: 2016-02-18 | Discharge: 2016-02-18 | Disposition: A | Discharge status: Home / Self Care | Payer: BC Managed Care – PPO | Source: Ambulatory Visit

## 2016-02-18 ENCOUNTER — Ambulatory Visit (INDEPENDENT_AMBULATORY_CARE_PROVIDER_SITE_OTHER): Payer: BC Managed Care – PPO

## 2016-02-18 DIAGNOSIS — Z96661 Presence of right artificial ankle joint: Secondary | ICD-10-CM

## 2016-02-18 DIAGNOSIS — Z471 Aftercare following joint replacement surgery: Secondary | ICD-10-CM | POA: Insufficient documentation

## 2016-02-18 DIAGNOSIS — Z981 Arthrodesis status: Secondary | ICD-10-CM | POA: Insufficient documentation

## 2016-02-18 DIAGNOSIS — M19079 Primary osteoarthritis, unspecified ankle and foot: Secondary | ICD-10-CM

## 2016-02-18 DIAGNOSIS — M1990 Unspecified osteoarthritis, unspecified site: Secondary | ICD-10-CM

## 2016-02-19 NOTE — Progress Notes (Addendum)
[place], [place] [address]                                PATIENT NAME: [name], [name]  DATE OF SERVICE:02/18/2016  DATE OF BIRTH: 05/30/1966    PROGRESS NOTE    DATE OF SURGERY:  November 05, 2014, right total ankle arthroplasty with right subtalar and talonavicular fusion.    SUBJECTIVE:  Mr. Treadway is a 50 year old male who is approximately 1 year status post the above-mentioned procedure.  The patient has been doing very well and has complete relief of his ankle pain.  The patient still has some pain and discomfort over the dorsum of his metatarsals.  The patient said this has been there and stayed the same since after surgery.  He has not had any worsening but it has not improved in the past 6 months.  The patient denies any motor or sensory changes to his right lower extremity.  He also denies any fevers, chills, nausea, vomiting, diarrhea, constipation or any other signs of systemic illness.    OBJECTIVE:  On examination, Mr. Niemeier is a well-appearing 50 year old male in no acute distress.  On examination of his right lower extremity, his skin is warm, dry and intact with well-healed incisions and no signs of erythema, drainage or infection.  The patient has maybe neutral dorsiflexion but is more like 5 degrees of plantarflexion to about 25-30 degrees of plantarflexion.  The patient complains more of pain in the dorsum of his foot over the metatarsals when standing on his feet.  It is not tender to palpation.  The patient has difficulty walking because he feels like he has to keep his knee straight at all times.  This is due to the lack of flexibility within the ankle joint.  The patient has normal sensation to the sural, saphenous, deep and superficial peroneal as well as tibial nerve distributions.  He has palpable DP and PT  pulses.    IMAGING:  X-rays of the patient's right ankle were reviewed on PACS and show a well-placed total ankle arthroplasty with well-aligned tibial and talar implants.  The patient also has 2 screws across the subtalar joint as well as the plate and screws across the talonavicular joint.  These all appear normal with no signs of broken screws or nonunions.  In addition, the patient does have some midfoot arthrosis of the distal joints to the talonavicular fusion.    ASSESSMENT:  A 50 year old male 1 year status post right total ankle arthroplasty with subtalar and talonavicular fusion.  Adjacent segment arthrosis of the midfoot.    PLAN:  At this time, we discussed with the patient that the implant in the ankle arthroplasty looks very good.  His fusion is stable and we think this is some of his difficulty with gait as well as pain in the forefoot.  We think he could improve both his pain as well as his gait pattern with a rocker-bottom sole.  We gave him a script for this and he any  get this done in [place] at the orthotics shop there.  Other than that, the patient can go back to normal activity without restrictions.  We will see him back in 6 months' time, however, we do not need x-rays at that time.      Consuelo Pandy, MD  Resident  [place] Department of Orthopaedics    Inda Coke, MD  Assistant Professor  [facility] Department of Orthopaedics    [id number]; D: 02/18/2016 16:43:33; T: 02/19/2016 18:13:56                 I saw and examined the patient.  I directly supervised the resident's activities and procedures.  I reviewed the resident's note.  I agree with the findings and plan of care as documented in the resident's note.    Fulton Reek, MD  Assistant Professor  Chief of Foot & Ankle Surgery  Department of [place] [facility]  02/21/2016, 11:53

## 2016-08-25 ENCOUNTER — Encounter (INDEPENDENT_AMBULATORY_CARE_PROVIDER_SITE_OTHER): Payer: BC Managed Care – PPO

## 2016-09-01 ENCOUNTER — Ambulatory Visit
Admission: RE | Admit: 2016-09-01 | Discharge: 2016-09-01 | Disposition: A | Discharge status: Home / Self Care | Payer: BC Managed Care – PPO | Source: Ambulatory Visit

## 2016-09-01 ENCOUNTER — Ambulatory Visit (INDEPENDENT_AMBULATORY_CARE_PROVIDER_SITE_OTHER): Payer: BC Managed Care – PPO

## 2016-09-01 ENCOUNTER — Ambulatory Visit (INDEPENDENT_AMBULATORY_CARE_PROVIDER_SITE_OTHER): Payer: Self-pay

## 2016-09-01 DIAGNOSIS — Z96661 Presence of right artificial ankle joint: Secondary | ICD-10-CM | POA: Insufficient documentation

## 2016-09-01 DIAGNOSIS — M19071 Primary osteoarthritis, right ankle and foot: Secondary | ICD-10-CM

## 2016-09-01 DIAGNOSIS — Z471 Aftercare following joint replacement surgery: Secondary | ICD-10-CM | POA: Insufficient documentation

## 2016-09-01 DIAGNOSIS — M19079 Primary osteoarthritis, unspecified ankle and foot: Secondary | ICD-10-CM

## 2016-09-01 DIAGNOSIS — M7989 Other specified soft tissue disorders: Secondary | ICD-10-CM

## 2016-09-02 NOTE — Progress Notes (Signed)
PATIENT NAME: [name] NUMBER:  [id number]  DATE OF SERVICE: 09/01/2016  DATE OF BIRTH:  05-13-1966    PROGRESS NOTE    DATE OF SURGERY:  November 05, 2014.    SUBJECTIVE:  Reporting a pain score of 2.  [name] is here to follow up with [place] regarding total ankle arthroplasty with subtalar fusion.  Essentially the rocker bottom sole modification has made all the difference in the world.    OBJECTIVE:  Neurovascular grossly intact.  Range of motion unchanged.  Minimal swelling.    IMAGING:  Radiographs show no identified appreciable loss of any of the positions of the components.    ASSESSMENT AND PLAN:  Total ankle on November 05, 2014.  This will serve as his 2 year followup.  He is doing well.  Continue with the rocker bottom sole modification.  Follow up in 1 year.        Leonard Reek, MD  Associate Professor   [place] Department of [place]              DD:  09/01/2016 21:37:07  DT:  09/02/2016 09:49:38 LK  D#:  [id number]

## 2016-10-11 ENCOUNTER — Other Ambulatory Visit: Payer: Self-pay

## 2017-11-02 ENCOUNTER — Encounter (INDEPENDENT_AMBULATORY_CARE_PROVIDER_SITE_OTHER): Payer: BC Managed Care – PPO

## 2018-08-23 ENCOUNTER — Ambulatory Visit (HOSPITAL_BASED_OUTPATIENT_CLINIC_OR_DEPARTMENT_OTHER)
Admission: RE | Admit: 2018-08-23 | Discharge: 2018-08-23 | Disposition: A | Discharge status: Home / Self Care | Payer: BC Managed Care – PPO | Source: Ambulatory Visit

## 2018-08-23 ENCOUNTER — Inpatient Hospital Stay (HOSPITAL_COMMUNITY): Payer: BC Managed Care – PPO

## 2018-08-23 ENCOUNTER — Encounter (HOSPITAL_COMMUNITY): Payer: Self-pay

## 2018-08-23 ENCOUNTER — Ambulatory Visit (HOSPITAL_BASED_OUTPATIENT_CLINIC_OR_DEPARTMENT_OTHER): Payer: BC Managed Care – PPO

## 2018-08-23 ENCOUNTER — Encounter (INDEPENDENT_AMBULATORY_CARE_PROVIDER_SITE_OTHER): Payer: Self-pay

## 2018-08-23 ENCOUNTER — Other Ambulatory Visit: Payer: Self-pay

## 2018-08-23 ENCOUNTER — Inpatient Hospital Stay
Admission: AD | Admit: 2018-08-23 | Discharge: 2018-08-29 | DRG: 493 | Disposition: A | Discharge status: Home Health | Payer: BC Managed Care – PPO | Source: Ambulatory Visit

## 2018-08-23 DIAGNOSIS — L02415 Cutaneous abscess of right lower limb: Secondary | ICD-10-CM | POA: Diagnosis present

## 2018-08-23 DIAGNOSIS — Z8249 Family history of ischemic heart disease and other diseases of the circulatory system: Secondary | ICD-10-CM

## 2018-08-23 DIAGNOSIS — Z9889 Other specified postprocedural states: Secondary | ICD-10-CM

## 2018-08-23 DIAGNOSIS — Z0389 Encounter for observation for other suspected diseases and conditions ruled out: Secondary | ICD-10-CM

## 2018-08-23 DIAGNOSIS — L03115 Cellulitis of right lower limb: Secondary | ICD-10-CM

## 2018-08-23 DIAGNOSIS — Y831 Surgical operation with implant of artificial internal device as the cause of abnormal reaction of the patient, or of later complication, without mention of misadventure at the time of the procedure: Secondary | ICD-10-CM | POA: Diagnosis present

## 2018-08-23 DIAGNOSIS — T8459XA Infection and inflammatory reaction due to other internal joint prosthesis, initial encounter: Principal | ICD-10-CM | POA: Diagnosis present

## 2018-08-23 DIAGNOSIS — Z01818 Encounter for other preprocedural examination: Secondary | ICD-10-CM

## 2018-08-23 DIAGNOSIS — Z720 Tobacco use: Secondary | ICD-10-CM

## 2018-08-23 DIAGNOSIS — M19071 Primary osteoarthritis, right ankle and foot: Secondary | ICD-10-CM

## 2018-08-23 DIAGNOSIS — Z923 Personal history of irradiation: Secondary | ICD-10-CM

## 2018-08-23 DIAGNOSIS — K219 Gastro-esophageal reflux disease without esophagitis: Secondary | ICD-10-CM | POA: Diagnosis present

## 2018-08-23 DIAGNOSIS — I8290 Acute embolism and thrombosis of unspecified vein: Secondary | ICD-10-CM

## 2018-08-23 DIAGNOSIS — M009 Pyogenic arthritis, unspecified: Secondary | ICD-10-CM | POA: Diagnosis present

## 2018-08-23 DIAGNOSIS — Z85830 Personal history of malignant neoplasm of bone: Secondary | ICD-10-CM

## 2018-08-23 DIAGNOSIS — M7989 Other specified soft tissue disorders: Secondary | ICD-10-CM

## 2018-08-23 DIAGNOSIS — Z79899 Other long term (current) drug therapy: Secondary | ICD-10-CM

## 2018-08-23 DIAGNOSIS — Z96661 Presence of right artificial ankle joint: Secondary | ICD-10-CM | POA: Diagnosis present

## 2018-08-23 DIAGNOSIS — M19079 Primary osteoarthritis, unspecified ankle and foot: Secondary | ICD-10-CM

## 2018-08-23 DIAGNOSIS — M109 Gout, unspecified: Secondary | ICD-10-CM | POA: Diagnosis present

## 2018-08-23 DIAGNOSIS — I10 Essential (primary) hypertension: Secondary | ICD-10-CM | POA: Diagnosis present

## 2018-08-23 DIAGNOSIS — E7800 Pure hypercholesterolemia, unspecified: Secondary | ICD-10-CM | POA: Diagnosis present

## 2018-08-23 LAB — ECG 12-LEAD
Atrial Rate: 77 {beats}/min
Calculated P Axis: 40 degrees
Calculated R Axis: -20 degrees
Calculated T Axis: 10 degrees
PR Interval: 170 ms
QRS Duration: 92 ms
QT Interval: 390 ms
QTC Calculation: 441 ms
Ventricular rate: 77 {beats}/min

## 2018-08-23 LAB — CBC WITH DIFF
BASOPHIL #: <0.1 x10ˆ3/uL (ref <=0.20)
BASOPHIL %: 1 %
EOSINOPHIL #: 0.15 x10ˆ3/uL (ref <=0.50)
EOSINOPHIL %: 2 %
HCT: 36 % — ABNORMAL LOW (ref 38.9–52.0)
HGB: 11.5 g/dL — ABNORMAL LOW (ref 13.4–17.5)
IMMATURE GRANULOCYTE #: <0.1 x10ˆ3/uL (ref <0.10)
IMMATURE GRANULOCYTE %: 1 % (ref 0–1)
LYMPHOCYTE #: 1.31 x10ˆ3/uL (ref 1.00–4.80)
LYMPHOCYTE %: 14 %
MCH: 28 pg (ref 26.0–32.0)
MCHC: 31.9 g/dL (ref 31.0–35.5)
MCV: 87.6 fL (ref 78.0–100.0)
MONOCYTE #: 0.68 x10ˆ3/uL (ref 0.20–1.10)
MONOCYTE %: 7 %
MPV: 9.8 fL (ref 8.7–12.5)
NEUTROPHIL #: 7.32 x10ˆ3/uL (ref 1.50–7.70)
NEUTROPHIL %: 75 %
PLATELETS: 560 x10ˆ3/uL — ABNORMAL HIGH (ref 150–400)
RBC: 4.11 x10ˆ6/uL — ABNORMAL LOW (ref 4.50–6.10)
RDW-CV: 13.9 % (ref 11.5–15.5)
WBC: 9.6 x10ˆ3/uL (ref 3.7–11.0)

## 2018-08-23 LAB — HGA1C (HEMOGLOBIN A1C WITH EST AVG GLUCOSE)
ESTIMATED AVERAGE GLUCOSE: 123 mg/dL
HEMOGLOBIN A1C: 5.9 % — ABNORMAL HIGH (ref 4.0–5.6)

## 2018-08-23 LAB — C-REACTIVE PROTEIN(CRP),INFLAMMATION: CRP INFLAMMATION: 20.9 mg/L — ABNORMAL HIGH (ref <=8.0)

## 2018-08-23 LAB — BASIC METABOLIC PANEL
ANION GAP: 11 mmol/L (ref 4–13)
BUN/CREA RATIO: 23 — ABNORMAL HIGH (ref 6–22)
BUN: 22 mg/dL (ref 8–25)
CALCIUM: 9.5 mg/dL (ref 8.5–10.2)
CALCIUM: 9.5 mg/dL (ref 8.5–10.2)
CHLORIDE: 103 mmol/L (ref 96–111)
CO2 TOTAL: 23 mmol/L (ref 22–32)
CREATININE: 0.96 mg/dL (ref 0.62–1.27)
ESTIMATED GFR: >60 mL/min/1.73mˆ2 (ref >60)
GLUCOSE: 79 mg/dL (ref 65–139)
POTASSIUM: 4.7 mmol/L (ref 3.5–5.1)
SODIUM: 137 mmol/L (ref 136–145)

## 2018-08-23 LAB — SEDIMENTATION RATE: ERYTHROCYTE SEDIMENTATION RATE (ESR): 82 mm/h — ABNORMAL HIGH (ref 0–15)

## 2018-08-23 LAB — ALBUMIN: ALBUMIN: 3.1 g/dL — ABNORMAL LOW (ref 3.5–5.0)

## 2018-08-23 MED ORDER — HYDROCODONE 5 MG-ACETAMINOPHEN 325 MG TABLET
1.00 | ORAL_TABLET | ORAL | Status: DC | PRN
Start: 2018-08-23 — End: 2018-08-26
  Administered 2018-08-24 (×2): 1 via ORAL
  Filled 2018-08-23 (×3): qty 1

## 2018-08-23 MED ORDER — ONDANSETRON HCL (PF) 4 MG/2 ML INJECTION SOLUTION
4.0000 mg | Freq: Four times a day (QID) | INTRAMUSCULAR | Status: DC | PRN
Start: 2018-08-23 — End: 2018-08-29

## 2018-08-23 MED ORDER — HYDROCODONE 5 MG-ACETAMINOPHEN 325 MG TABLET
2.00 | ORAL_TABLET | ORAL | Status: DC | PRN
Start: 2018-08-23 — End: 2018-08-26
  Administered 2018-08-23: 2 via ORAL
  Filled 2018-08-23: qty 2

## 2018-08-23 MED ORDER — ALUMINUM-MAG HYDROXIDE-SIMETHICONE 400 MG-400 MG-40 MG/5 ML ORAL SUSP
5.0000 mL | ORAL | Status: DC | PRN
Start: 2018-08-23 — End: 2018-08-29

## 2018-08-23 MED ORDER — SENNOSIDES 8.6 MG-DOCUSATE SODIUM 50 MG TABLET
1.0000 | ORAL_TABLET | Freq: Every evening | ORAL | Status: DC | PRN
Start: 2018-08-23 — End: 2018-08-29
  Filled 2018-08-23 (×2): qty 1

## 2018-08-23 MED ORDER — FLU VACCINE QS 2019-20(6MOS UP)(PF) 60 MCG(15 MCGX4)/0.5 ML IM SYRINGE
0.5000 mL | INJECTION | Freq: Once | INTRAMUSCULAR | Status: AC
Start: 2018-08-23 — End: 2018-08-23
  Administered 2018-08-23: 0.5 mL via INTRAMUSCULAR
  Filled 2018-08-23: qty 0.5

## 2018-08-23 MED ORDER — LACTATED RINGERS INTRAVENOUS SOLUTION
INTRAVENOUS | Status: DC
Start: 2018-08-23 — End: 2018-08-27

## 2018-08-23 MED ORDER — ACETAMINOPHEN 325 MG TABLET
650.00 mg | ORAL_TABLET | ORAL | Status: DC | PRN
Start: 2018-08-23 — End: 2018-08-29
  Administered 2018-08-24 – 2018-08-26 (×5): 650 mg via ORAL
  Filled 2018-08-23 (×5): qty 2

## 2018-08-23 NOTE — H&P (Signed)
PATIENT NAME: [name] NUMBER:  [id number]  DATE OF SERVICE: 08/23/2018  DATE OF BIRTH:  Sep 23, 1966    HISTORY AND PHYSICAL    CHIEF COMPLAINT:  Right lower extremity swelling with recent infection.    HISTORY OF PRESENT ILLNESS:  [name] presents today at the clinic as recommended from the Emergency Department in [place].  He was seen in [facility] Emergency Department on August 13, 2018, for right lower extremity swelling and erythema.  He had walked in the woods the day prior to that and started to feel chills that night.  The next day he noticed some swelling and erythema around his right ankle.  He went to the Emergency Department and they started him on p.o. clindamycin.  He was on clindamycin for 3 days and on Tuesday, August 16, 2018, he went back in because his swelling had gotten worse and his erythema had spread to his groin and was not controlled with the oral antibiotics.  He was placed him on outpatient IV vancomycin and Rocephin until Saturday, August 20, 2018.  The next day his leg started to get more red again.  During this work up he had been told to follow up with his foot and ankle surgeon for his arthroplasty that was done back in November 05, 2014.      Today, he states he has pain in his ankle  with ambulating.  He has not had fevers or chills since the initial fever and chills. He states his erythema is getting worse again.  He denies any drainage from his scar sites.  They are well healed.  He states that his swelling and range of motion are at baseline.      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  He underwent 25 treatments of radiation therapy and most recently completed this in August of this year.     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  Past medical history is also significant for high cholesterol, hypertension.    PAST SURGICAL  HISTORY:  He has had 3 ankle surgeries as well as knee arthroscopy.  November 05, 2014, he had a right total ankle arthroplasty.    MEDICATIONS:  Currently he is on:  1. Amlodipine.  2. Atorvastatin.  3. Nexium.  4. Spironolactone.  5. Temazepam.  6. Colchicine.    FAMILY HISTORY:  Significant for hypertension.    REVIEW OF SYSTEMS:  Negative except as listed in the HPI.    PHYSICAL EXAMINATION:  On exam, [name] is in no acute distress.  He is well developed and well nourished.  His respirations are nonlabored.  His abdomen is soft, not distended.  With regard to his right lower extremity, he has significant erythema around his right ankle up to his mid calf.  He is tender to palpation.  He is tender at his right medial malleolus and the skin around it.  He has limited range of motion his ankle and some pain with dorsiflexion and plantarflexion.  His lower leg is warm.  There is no obvious drainage.  His scars do not have any dehiscence.  No obvious area of fluctuance or abscess.                    IMAGING:  X-rays were obtained today which show no signs of hardware loosening or failure.  No obvious signs  of fluid collection.    ASSESSMENT:  [name] presents with right lower extremity infection.    PLAN:  Mr. Sedler will be admitted to the hospital for further evaluation of his infection and to rule out any infected joint.  At this time we have ordered a duplex scan to rule out any DVT.  Also checking white blood cells, albumin, inflammatory markers, and a white blood cell tagged nuclear scan to evaluate for infection in the bone or joint.  We discussed with him that if there is no infection in the joint we quite likely get him IV antibiotics and get him treated as an outpatient but if this involves the joint, he will have to have surgery.  Depending on if it goes into the bones, he may have to have more significant surgery.  No need to keep him n.p.o. at this time, but we will also check blood  cultures.     The patient was seen in conjunction with Dr. Irena Cords.        Ervin Knack, MD      Fulton Reek, MD  Associate Professor   [place] Department of Orthpaedics              DD:  08/23/2018 10:23:09  DT:  08/23/2018 11:21:18 JB  D#:  [id number]  I saw and examined the patient.  I directly supervised the resident's activities and procedures.  I reviewed the resident's note.  I agree with the findings and plan of care as documented in the resident's note.    Leonard Baltimore Kurtis Bushman, MD  Associate Professor  Chief of Foot & Ankle Surgery  Department of [place] [facility]  08/24/2018 7:58 AM

## 2018-08-23 NOTE — Care Plan (Signed)
Problem: Adult Inpatient Plan of Care  Goal: Plan of Care Review  Outcome: Ongoing (see interventions/notes)  Note:   Plan of care reviewed. Pt AOx4 and cooperative with care this shift. Pain managed with meds per MAR. Assessment per doc flow. Pt free from falls this shift. Needs met with hourly rounding. Pt resting at this time with call bell in reach. Wheels locked and bed in lowest position. Will continue to monitor. Blood cultures and labs obtained . STAT placed PIV. LR at 69m/hr.   Goal: Patient-Specific Goal (Individualization)  Outcome: Ongoing (see interventions/notes)  Flowsheets (Taken 08/23/2018 1108)  Patient-Specific Goals (Include Timeframe): walk out of here tomorrow.  Goal: Absence of Hospital-Acquired Illness or Injury  Outcome: Ongoing (see interventions/notes)  Goal: Optimal Comfort and Wellbeing  Outcome: Ongoing (see interventions/notes)  Goal: Rounds/Family Conference  Outcome: Ongoing (see interventions/notes)     Problem: Skin or Soft Tissue Infection  Goal: Infection Symptom Resolution  Outcome: Ongoing (see interventions/notes)     Duplex completed today. NUC infection imaging to be completed tomorrow.     [facility] Highest Level of Mobility Goal      Date: 08/23/18     JH-HLM Goal: 6    Exercise Level: 6- Walked 10 steps or more    Goal Outcome: Goal Achieved

## 2018-08-23 NOTE — Progress Notes (Signed)
Progress Note    Patient admitted to 7NE bed 23. See H&P from clinic earlier today for detailed history and plan.    Awaiting lab results and nuclear WBC tagged scan which will be performed tomorrow.    Pam Drown, MD  Resident, PGY-1  Department of Orthopaedics  Pager [phone]  08/23/2018 16:37  I saw and examined the patient.  I directly supervised the resident's activities and procedures.  I reviewed the resident's note.  I agree with the findings and plan of care as documented in the resident's note.    Herbie Baltimore Kurtis Bushman, MD  Associate Professor  Chief of Foot & Ankle Surgery  Department of [place] [facility]  08/24/2018 7:58 AM

## 2018-08-24 ENCOUNTER — Inpatient Hospital Stay (HOSPITAL_COMMUNITY): Payer: BC Managed Care – PPO

## 2018-08-24 DIAGNOSIS — L02415 Cutaneous abscess of right lower limb: Secondary | ICD-10-CM

## 2018-08-24 DIAGNOSIS — L03115 Cellulitis of right lower limb: Secondary | ICD-10-CM

## 2018-08-24 LAB — PT/INR
INR: 1.14 (ref 0.80–1.20)
PROTHROMBIN TIME: 13.4 s (ref 9.5–14.1)

## 2018-08-24 MED ORDER — TEMAZEPAM 15 MG CAPSULE
15.00 mg | ORAL_CAPSULE | Freq: Every evening | ORAL | Status: DC | PRN
Start: 2018-08-24 — End: 2018-08-24

## 2018-08-24 MED ORDER — AMLODIPINE 5 MG-OLMESARTAN 20 MG TABLET
1.0000 | ORAL_TABLET | Freq: Every day | ORAL | Status: DC
Start: 2018-08-24 — End: 2018-08-24

## 2018-08-24 MED ORDER — PANTOPRAZOLE 20 MG TABLET,DELAYED RELEASE
20.0000 mg | DELAYED_RELEASE_TABLET | Freq: Every morning | ORAL | Status: DC
Start: 2018-08-24 — End: 2018-08-29
  Administered 2018-08-24 – 2018-08-25 (×2): 20 mg via ORAL
  Administered 2018-08-26: 0 mg via ORAL
  Administered 2018-08-27 – 2018-08-29 (×3): 20 mg via ORAL
  Filled 2018-08-24 (×5): qty 1

## 2018-08-24 MED ORDER — ATORVASTATIN 10 MG TABLET
10.00 mg | ORAL_TABLET | Freq: Every evening | ORAL | Status: DC
Start: 2018-08-24 — End: 2018-08-29
  Administered 2018-08-24 – 2018-08-28 (×5): 10 mg via ORAL
  Filled 2018-08-24 (×6): qty 1

## 2018-08-24 MED ORDER — SPIRONOLACTONE 25 MG TABLET
25.00 mg | ORAL_TABLET | Freq: Every morning | ORAL | Status: DC
Start: 2018-08-24 — End: 2018-08-29
  Administered 2018-08-24 – 2018-08-25 (×2): 25 mg via ORAL
  Administered 2018-08-26: 0 mg via ORAL
  Administered 2018-08-27 – 2018-08-29 (×3): 25 mg via ORAL
  Filled 2018-08-24 (×5): qty 1

## 2018-08-24 MED ORDER — VALSARTAN 80 MG TABLET
80.0000 mg | ORAL_TABLET | Freq: Every day | ORAL | Status: DC
Start: 2018-08-24 — End: 2018-08-29
  Administered 2018-08-24 – 2018-08-25 (×2): 80 mg via ORAL
  Administered 2018-08-26: 0 mg via ORAL
  Administered 2018-08-27 – 2018-08-29 (×3): 80 mg via ORAL
  Filled 2018-08-24 (×6): qty 1

## 2018-08-24 MED ORDER — AMLODIPINE 5 MG TABLET
5.0000 mg | ORAL_TABLET | Freq: Every day | ORAL | Status: DC
Start: 2018-08-24 — End: 2018-08-29
  Administered 2018-08-24 – 2018-08-25 (×2): 5 mg via ORAL
  Administered 2018-08-26: 0 mg via ORAL
  Administered 2018-08-27 – 2018-08-29 (×3): 5 mg via ORAL
  Filled 2018-08-24 (×5): qty 1

## 2018-08-24 MED ORDER — COLCHICINE 0.6 MG TABLET
0.6000 mg | ORAL_TABLET | Freq: Every day | ORAL | Status: DC
Start: 2018-08-24 — End: 2018-08-26
  Administered 2018-08-24 – 2018-08-25 (×2): 0.6 mg via ORAL
  Filled 2018-08-24 (×3): qty 1

## 2018-08-24 MED ADMIN — sodium chloride 0.9 % (flush) injection syringe: ORAL | @ 20:00:00

## 2018-08-24 MED ADMIN — acetaminophen 1,000 mg/100 mL (10 mg/mL) intravenous solution: ORAL | @ 15:00:00

## 2018-08-24 NOTE — Progress Notes (Signed)
[place] [facility]  Service: Orthopaedics  Attending: Irena Cords  Progress Note  08/24/2018    Name: [name]  DOB: 04/22/1966  MRN: [id number]      SUBJECTIVE:  52 y.o. male resting in bed. Duplex scan was negative yesterday.  He has no complaints. Mild pain in his R ankle this morning similar to yesterday.    OBJECTIVE:  AF, VSS   BP 106/66   Pulse 70   Temp 36.7 C (98.1 F)   Resp 18   Ht 1.829 m (6')   Wt 124.3 kg (274 lb)   SpO2 97%   BMI 37.16 kg/m          GEN - NAD,  resting in bed    RLE:  -- Inspection: Still erythematous from mid calf distally. Swelling is similar to yesterday  -- Sensation: SILT dorsal and plantar foot. SILT to exposed toes.  --Motor: Wiggles all toes on command. + ankle dorsiflexion and plantarflexion, + EHL/FHL.  --CV: 2+ DP/PT pulse. BCR < 2 sec in all toes     Recent Pertinent Imaging:  - Duplex scan negative. Awaiting WBC tagged nuclear imaging today    Today's Pertinent Labs:  - ESR 82, CRP 20.9, WBC 9.6    ASSESSMENT:  53 y.o. male  s/p R TAA in 2016 who presents with RLE erythema and tenderness concerning for cellulitis vs ankle infection.    PLAN:  - Weightbearing: WBAT RLE  - DVT prophylaxis: SCD's and ambulation  - Antibiotics: Awaiting blood cultures  - Labs: Elevated inflammatory markers: ESR 82. CRP 20.9  - Imaging: Duplex negative for DVT. Awaiting nuclear WBC tagged scan today.  - Diet: NPO for now. Will discuss with attending and advance if no plan for OR today.  - Dispo: pending scan today and blood cultures  - Follow-up: will arrange closer to follow up.    Pam Drown, MD  Resident, PGY-1  Department of Orthopaedics  Pager [phone]  08/24/2018 06:03    I saw and examined the patient.  I directly supervised the resident's activities and procedures.  I reviewed the resident's note.  I agree with the findings and plan of care as documented in the resident's note.    Herbie Baltimore Kurtis Bushman, MD  Associate Professor  Chief of Foot & Ankle  Surgery  Department of [place] [facility]  08/24/2018 7:59 AM

## 2018-08-24 NOTE — Nurses Notes (Signed)
Pt brought down to Nuclear Medicine via wheelchair with escort transport, for the 1st round of test.

## 2018-08-24 NOTE — Care Plan (Signed)
Pt will return home once discharged.

## 2018-08-24 NOTE — Nurses Notes (Signed)
Nuclear Med called that pt is scheduled to go down 08-1114  then back again at 2pm; said that there is no NPO restriction for the scan; pt is asking if he could have light breakfast this morning if possible, paged MD Guadalupe Maple, awaits call back.

## 2018-08-24 NOTE — Care Management Notes (Addendum)
[facility] Management Initial Evaluation    Patient Name: [name]  Date of Birth: 10-Feb-1966  Sex: male  Date/Time of Admission: 08/23/2018 10:33 AM  Room/Bed: 23/A  Payor: BLUE CROSS BLUE SHIELD / Plan: [place]/BS PPO / Product Type: PPO /   Primary Care Providers:  Brandon Melnick, MD, MD (General)    Pharmacy Info:   Preferred Pharmacy     [facility] [place], [place] - [address]    [address] [place] [address]    Phone: [phone] Fax: [phone]    Not a 24 hour pharmacy; exact hours not known.    [facility] [place], [place] [address]    Phone: [phone] Fax: [phone]    Not a 24 hour pharmacy; exact hours not known.        Emergency Contact Info:   Extended Emergency Contact Information  Primary Emergency Contact: Ehle,AMY   [place] Phone: [phone]  Relation: Wife    History:   [name] is a 52 y.o., male, admitted cellulitis and abscess of right lower extremity    Height/Weight: 182.9 cm (6') / 124.3 kg (274 lb)     LOS: 1 day   Admitting Diagnosis: Cellulitis and abscess of right lower extremity [L03.115, L02.415]    Assessment:      08/24/18 1040   Assessment Details   Assessment Type Admission   Date of Care Management Update 08/24/18   Date of Next DCP Update 08/26/18   Readmission   Is this a readmission? No   Medicare Intent to Discharge Documentation   Admit IMM given to: Patient   Care Management Plan   Discharge Planning Status initial meeting   Projected Discharge Date 08/26/18   CM will evaluate for rehabilitation potential yes   Patient choice offered to patient/family no   Form for patient choice reviewed/signed and on chart no   Patient aware of possible cost for ambulance transport?  No   Discharge Needs Assessment   Equipment Currently Used at Home none   Discharge Facility/Level of Care Needs  Home (Patient/Family Member/other)(code 1)   Transportation Available car;family or friend will provide   Referral Information   Admission Type inpatient   Address Verified verified-no changes   Arrived From admitted as an inpatient;home or self-care   Insurance Verified verified-no change   ADVANCE DIRECTIVES   Does the Patient have an Advance Directive? No, Information Offered and Refused   Patient Requests Assistance in Having Advance Directive Notarized. N/A   Employment/Financial   Patient has Prescription Coverage?  Yes   Financial Concerns none   Living Environment   Select an age group to open "lives with" row.  Adult   Lives With spouse   Living Arrangements house   Able to Return to Prior Arrangements yes   Home Safety   Home Assessment: No Problems Identified   Home Accessibility no concerns;stairs to enter home   Living Environment   Number of Stairs to [place] 3     MSW completed initial assessment with Pt at bedside. Family members were present. Pt stated they could be present for assessment. PT's address is confirmed. Pt lives in a house with his wife Amy. Pt states that he will have help at home if needed and will have transportation once  discharged. Pt states he has 3 steps entering his home but he is not concerned with accessibility. Pt's insurance, PCP and pharmacy is confirmed. However, Pt's main pharmacy is no longer in business. Pt claims he last visited with his PCP 6 months ago. Pt states he has never used HH and does not have DME. Pt has an MPOA in place. Pt's family requested information on discounted hotels. MSW referred Pt's family to family house and provided them a contact number.     Discharge Plan:  Home (Patient/Family Member/other) (code 1)    The patient will continue to be evaluated for developing discharge needs.     Case Manager: [place], [place]  Phone: [phone]

## 2018-08-24 NOTE — Nurses Notes (Signed)
To nuclear medicine.

## 2018-08-24 NOTE — Care Plan (Signed)
Kept NPO post midnight, OR shower/baths done, Headache relieved by PRN Norco, tolerable pain on RLE.    Problem: Adult Inpatient Plan of Care  Goal: Plan of Care Review  Outcome: Ongoing (see interventions/notes)  Goal: Patient-Specific Goal (Individualization)  Outcome: Ongoing (see interventions/notes)  Flowsheets (Taken 08/24/2018 0155)  Individualized Care Needs: OOB independently (up ad lib)  Anxieties, Fears or Concerns: "I hope I don't have blood clot on my R leg"-per pt  Patient-Specific Goals (Include Timeframe): for WBC tagged nuclear scan today 08/24/2018 to evaluate for infection in the bone or joint.  Goal: Absence of Hospital-Acquired Illness or Injury  Outcome: Ongoing (see interventions/notes)  Problem: Skin or Soft Tissue Infection  Goal: Infection Symptom Resolution  Outcome: Ongoing (see interventions/notes)     Problem: Venous Thromboembolism  Goal: VTE (Venous Thromboembolism) Symptom Resolution  Outcome: Ongoing (see interventions/notes)  Note:   RLE duplex scan completed 08/24/2018     Problem: Infection  Goal: Infection Symptom Resolution  Outcome: Ongoing (see interventions/notes)     Problem: Fall Injury Risk  Goal: Absence of Fall and Fall-Related Injury  Outcome: Ongoing (see interventions/notes)

## 2018-08-24 NOTE — Care Plan (Signed)
Plan of care reviewed with patient  Up ad lib. NPO for possible surgery today. Awaiting result of Nuclear medicine test tagging [facility] for infection. Labs to monitor infection. Norco for pain in right ankle prn.   Discharge plan pending results of tests.    Problem: Adult Inpatient Plan of Care  Goal: Plan of Care Review  Outcome: Ongoing (see interventions/notes)  Flowsheets  Taken 08/24/2018 1450  Plan of Care Reviewed With: patient  Taken 08/24/2018 1535  Progress: improving  Goal: Patient-Specific Goal (Individualization)  Outcome: Ongoing (see interventions/notes)  Flowsheets (Taken 08/24/2018 1535)  Individualized Care Needs: takes medications for increased fluid in left eye     Problem: Fall Injury Risk  Goal: Absence of Fall and Fall-Related Injury  Outcome: Ongoing (see interventions/notes)     Problem: Infection  Goal: Infection Symptom Resolution  Outcome: Ongoing (see interventions/notes)

## 2018-08-24 NOTE — Nurses Notes (Signed)
Returned from nuclear medicine.

## 2018-08-24 NOTE — Nurses Notes (Signed)
Returned to nuclear medicine for pictures

## 2018-08-25 DIAGNOSIS — E785 Hyperlipidemia, unspecified: Secondary | ICD-10-CM

## 2018-08-25 DIAGNOSIS — M109 Gout, unspecified: Secondary | ICD-10-CM

## 2018-08-25 DIAGNOSIS — I10 Essential (primary) hypertension: Secondary | ICD-10-CM

## 2018-08-25 LAB — PT/INR
INR: 1.15 (ref 0.80–1.20)
INR: 1.15 (ref 0.80–1.20)
PROTHROMBIN TIME: 13.5 s (ref 9.5–14.1)

## 2018-08-25 LAB — TYPE AND CROSS RED CELLS - UNITS
ABO/RH(D): A POS
ANTIBODY SCREEN: NEGATIVE
UNITS ORDERED: 2

## 2018-08-25 MED ORDER — VANCOMYCIN 1 GRAM/200 ML IN DEXTROSE 5 % INTRAVENOUS PIGGYBACK
1.0000 g | INJECTION | Freq: Once | INTRAVENOUS | Status: DC | PRN
Start: 2018-08-25 — End: 2018-08-25

## 2018-08-25 NOTE — Care Plan (Signed)
Complains of headache controlled by PRN Tylenol, tolerable pain on RLE-declines any stronger pain medication, verbalized sadness and anxiety about his current condition, emotional support provided, ongoing treatment and discharge planning, duplex scan negative for DVT.    Problem: Adult Inpatient Plan of Care  Goal: Plan of Care Review  Outcome: Ongoing (see interventions/notes)  Goal: Patient-Specific Goal (Individualization)  Outcome: Ongoing (see interventions/notes)  Flowsheets (Taken 08/25/2018 0235)  Individualized Care Needs: goes by "Joe"; OOB independently  Anxieties, Fears or Concerns: "I am anxious and sad about not being able to work and walk normally again for 1 1/2 year per doctor due to this infection"-per patient  Patient-Specific Goals (Include Timeframe): pending final result of nuclear imaging/scan RLE    Problem: Skin or Soft Tissue Infection  Goal: Infection Symptom Resolution  Outcome: Ongoing (see interventions/notes)     Problem: Fall Injury Risk  Goal: Absence of Fall and Fall-Related Injury  Outcome: Ongoing (see interventions/notes)     Problem: Infection  Goal: Infection Symptom Resolution  Outcome: Ongoing (see interventions/notes)

## 2018-08-25 NOTE — Progress Notes (Signed)
[facility]  Service: Orthopaedics  Attending: Irena Cords  Progress Note  08/25/2018    Name: [name]  DOB: May 10, 1966  MRN: [id number]      SUBJECTIVE:  52 y.o. male resting in bed. No acute events. He states he is dealing with the news of needing surgery and having over a year of recovery.    OBJECTIVE:  AF, VSS   BP 138/84   Pulse 76   Temp 36.6 C (97.9 F)   Resp 18   Ht 1.829 m (6')   Wt 124.3 kg (274 lb)   SpO2 97%   BMI 37.16 kg/m          GEN - NAD,  resting in bed    RLE:  -- Inspection: Erythema similar to yesterday.  -- Sensation: SILT dorsal and plantar foot. SILT to exposed toes.  --Motor: Wiggles all toes on command. + ankle dorsiflexion and plantarflexion, + EHL/FHL.  --CV: 2+ DP/PT pulse. BCR < 2 sec in all toes     Recent Pertinent Imaging:  - Nuclear scan positive for infection in R ankle    Today's Pertinent Labs:  - PT 13.5, INR 1.15    ASSESSMENT:  52 y.o. male  s/p R TAA in 2016 who presents with RLE erythema and tenderness concerning for cellulitis vs ankle infection.     PLAN:  - OR tomorrow for I&D, explant of TAA, antibiotic spacer placement, and ex-fix.  - NPO at midnight, Pre-op labs and studies completed. Type and crossed for 2 units for OR.  - Weightbearing: WBAT RLE for today  - DVT prophylaxis: SCD's and ambulation. Will add chemoprophylaxis post op.  - Antibiotics: will start post op  - Labs: Elevated inflammatory markers: ESR 82. CRP 20.9  - Imaging: Nuclear scan was positive for infection in ankle  - Diet: General today and NPO at midnight  - Follow-up: will arrange closer to follow up.    Pam Drown, MD  Resident, PGY-1  Department of Orthopaedics  Pager [phone]  08/25/2018 08:14    I counseled with the resident by phone, and/or by computer access to the patient's chart. We agreed on the treatment plan.  I reviewed the resident's note.  I agree with the findings and plan of care as documented in the resident's note.    Herbie Baltimore Kurtis Bushman,  MD  Associate Professor  Chief of Foot & Ankle Surgery  Department of [place] [facility]  08/25/2018 8:36 AM

## 2018-08-25 NOTE — Care Plan (Signed)
Plan of care reviewed with patient  Up ad lib; walking in room. Patient denies need for pain medication for ankle but continues to have headache. Tylenol for pain. Tolerating regular diet. NPO at midnight. Right ankle; skin ruddy. Plan for OR in am. Patient family at bedside and supportive of patient.    Problem: Adult Inpatient Plan of Care  Goal: Plan of Care Review  Outcome: Ongoing (see interventions/notes)  Flowsheets (Taken 08/25/2018 1900)  Progress: improving  Goal: Patient-Specific Goal (Individualization)  Outcome: Ongoing (see interventions/notes)  Flowsheets (Taken 08/25/2018 2056)  Patient-Specific Goals (Include Timeframe): surgery in am     Problem: Skin or Soft Tissue Infection  Goal: Infection Symptom Resolution  Outcome: Ongoing (see interventions/notes)     Problem: Venous Thromboembolism  Goal: VTE (Venous Thromboembolism) Symptom Resolution  Outcome: Ongoing (see interventions/notes)     Problem: Infection  Goal: Infection Symptom Resolution  Outcome: Ongoing (see interventions/notes)

## 2018-08-25 NOTE — Consults (Signed)
[facility]  MEDICINE CONSULT    [name], [name], 52 y.o. male  Date of Birth:  02-08-1966  Date of Admission:  08/23/2018  Date of service: 08/25/2018    Service: [name]  Requesting MD: Dr. Irena Cords     Reason for consultation:Preoperative evaluation    Assessment/Recommendation(s):   [name] is a 52 y.o. male with PMHx of HTN, HLD, GERD, gout, OSA presenting with right ankle hardware infection.     HTN  - Can continue amlodipine/olmesartan  home dose    HLD   - Continue lipitor 10mg  QD    Gout  - Recommend holding colchicine with acute infection    Eye fluid ? 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 Monitoring currently.     PreOp EKG - NSR, left axis deviation     Revised Cardiac Risk Index    Intrathoracic or intraperitoneal surgery 0  CAD (Hx-MI, (+) EST, angina, nitroglycerin use, or path Q's) 0  Heart failure 0  Creatine greater than 2.0  0  Insulin-requiring diabetes mellitus  0  TIA or CVA (do not include dizziness)  0  Total Points: 0  The risk of major cardiac complications varies according to the number of risk factors. If one includes only cardiac death, nonfatal MI, and nonfatal cardiac arrest as major cardiac events in the [name], the following rates of adverse outcomes were seen:  No risk factors -- 0.4 percent (95% CI 0.1-0.8 percent)   One risk factor -- 1.0 percent (95% CI 0.5-1.4 percent)   Two risk factors -- 2.4 percent (95% CI 1.3-3.5 percent)   Three or more risk factors -- 5.4 percent (95% CI 2.8-7.9 percent)  *Perioperative cardiac events in patients undergoing noncardiac surgery: a review of the magnitude of the problem, the pathophysiology of the events and methods to estimate and communicate risk.[name], [name]. 2005;173(6):627.     Preoperative evaluation: DVT prophylaxis (consider lovenox given history of CA) and incentive spirometry per primary team.  No further recommendations prior to surgery.    Discussion (elaboration on assessment or recommendations):Surgical urgency  Elective  Recent, premorbid functional capacity (without symptoms)  METSs 4 or more    HPI    Patient states that his leg has been swelling and painful since the end of October. He was previoulsy admitted to another hospital for longterm antibiotics and was transferred due to failure to improve. His nuclear wbc scan was concerning for infection of hardware in RLE ankle.   Patient states there has been no draiange and currenlty pain is minimal.   He has no history of CAD, stroke, HF, diabetes, CKD, PAD. He does not smoke. He does use smokeless tobacco. Currently tobacco has no nicotine in it. He is able to perform normal activities and walk up stairs without exertional dyspnea or chest pain. He denies palpitation.   He did recently complete radiation treatment for right hip bone cancer that his doctor told him is down to 5% and currently he is just being monitored. He never received chemotherapy and wife states that it was not in the bone marrow and was excluded to the one section in his hip.     Past Medical History:   Diagnosis Date   . CPAP (continuous positive airway pressure) dependence     setting 13   . Difficult intubation    . Esophageal reflux    .  Essential hypertension    . Hyperlipidemia    . Rash     rle, told he had psorasis, seen derm, given cream   . Sleep apnea          Past Surgical History:   Procedure Laterality Date   . HX KNEE SURGERY      right   . HX OTHER      right ankle x3   . HX TONSILLECTOMY           No Known Allergies  Family History  No pertinent family history     Social History  Social History     Tobacco Use   . Smoking status: Never Smoker   . Smokeless tobacco: Current User     Types: Snuff   Substance Use Topics   . Alcohol use: Yes     Alcohol/week: 12.0 standard drinks     Types: 12 Cans of beer per week        Medications Prior to Admission      Prescriptions    AMLODIPINE BES/OLMESARTAN MED (AZOR ORAL)    Take by mouth    atorvastatin (LIPITOR) 10 mg Oral Tablet    Take 10 mg by mouth Once a day    colchicine 0.6 mg Oral Tablet    Take 0.6 mg by mouth Once a day    esomeprazole magnesium (NEXIUM) 20 mg Oral Capsule, Delayed Release(E.C.)    Take 20 mg by mouth Every morning before breakfast    multivitamin Oral Tablet    Take 1 Tab by mouth Once a day    spironolactone (ALDACTONE) 25 mg Oral Tablet    Take 25 mg by mouth Every morning with breakfast    temazepam (RESTORIL) 15 mg Oral Capsule    Take 15 mg by mouth Every night as needed for Insomnia          Current Facility-Administered Medications:  acetaminophen (TYLENOL) tablet 650 mg Oral Q4H PRN   aluminum-magnesium hydroxide-simethicone (MAALOX MAX) 400-400-40mg  per 65mL oral liquid 5 mL Oral Q4H PRN   amLODIPine (NORVASC) tablet 5 mg Oral Daily   And      valsartan (DIOVAN) tablet 80 mg Oral Daily   atorvastatin (LIPITOR) tablet 10 mg Oral NIGHTLY   colchicine tablet 0.6 mg Oral Daily   HYDROcodone-acetaminophen (NORCO) 5-325 mg per tablet 1 Tab Oral Q4H PRN   Or      HYDROcodone-acetaminophen (NORCO) 5-325 mg per tablet 2 Tab Oral Q4H PRN   LR premix infusion  Intravenous Continuous   ondansetron (ZOFRAN) 2 mg/mL injection 4 mg Intravenous Q6H PRN   pantoprazole (PROTONIX) delayed release tablet 20 mg Oral Daily before Breakfast   sennosides-docusate sodium (SENOKOT-S) 8.6-50mg  per tablet 1 Tab Oral HS PRN   spironolactone (ALDACTONE) tablet 25 mg Oral Daily with Breakfast       ROS:  Constitutional: no recent illness  Eyes:  no vision issues  ENT: no ENT concerns.   CV:  no chest pain, dizziness, palpitations, fast heart rate  Resp:  no shortness of breath, cough, wheeze  GI:  history of acid reflux.   GU:  no dysuria, polyuria  Neuro / Psych:  no history of stroke  Heme / Allergy / Endo:  no easy bruising or bleeding   Skin:  no rashes     EXAM:  Temperature: 36.7 C (98.1 F)  Heart Rate: 77  BP  (Non-Invasive): 139/78  Respiratory Rate: 20  SpO2: 100 %  Constitutional:  appears in good health, appears stated age, no distress and vital signs reviewed  Eyes: Conjunctiva clear., Pupils equal and round. Eyes are prominent   ENT: no nasal discharge, mucous membranes moist  Neck: supple, symmetrical, trachea midline  Respiratory: Clear to auscultation bilaterally.   Cardiovascular: regular rate and rhythm, S1, S2 normal, no murmur, click, rub or gallop  Gastrointestinal: Soft, non-tender, Bowel sounds normal, non-distended, No hepatosplenomegaly  Genitourinary: Deferred  Musculoskeletal: Head atraumatic and normocephalic and AROM  Integumentary:  Skin warm and dry and No rashes  Neurologic: Grossly normal, Alert and oriented x3  Lymphatic/Immunologic/Hematologic: No lymphadenopathy  Psychiatric: Normal affect, behavior, memory, thought content, judgement, and speech.        Studies:  I have reviewed all available studies within the electronic medical record.    Jaye Beagle, DO      I saw and examined the patient.  I reviewed the resident's note.  I agree with the findings and plan of care as documented in the resident's note.  Any exceptions/additions are edited/noted.    Clemens Catholic, MD

## 2018-08-26 ENCOUNTER — Encounter (HOSPITAL_COMMUNITY): Payer: Self-pay

## 2018-08-26 ENCOUNTER — Inpatient Hospital Stay (HOSPITAL_COMMUNITY): Payer: BC Managed Care – PPO | Admitting: ANESTHESIOLOGY

## 2018-08-26 ENCOUNTER — Encounter (HOSPITAL_COMMUNITY): Payer: Self-pay | Admitting: Anesthesiology

## 2018-08-26 ENCOUNTER — Inpatient Hospital Stay (HOSPITAL_COMMUNITY)
Admission: RE | Admit: 2018-08-26 | Discharge: 2018-08-26 | Disposition: A | Discharge status: Home / Self Care | Payer: BC Managed Care – PPO | Source: Ambulatory Visit

## 2018-08-26 ENCOUNTER — Encounter (HOSPITAL_COMMUNITY): Payer: BC Managed Care – PPO | Admitting: Anesthesiology

## 2018-08-26 ENCOUNTER — Inpatient Hospital Stay (HOSPITAL_COMMUNITY): Payer: BC Managed Care – PPO

## 2018-08-26 ENCOUNTER — Encounter (HOSPITAL_COMMUNITY): Admission: AD | Disposition: A | Discharge status: Home Health | Payer: Self-pay | Source: Ambulatory Visit

## 2018-08-26 DIAGNOSIS — T8459XA Infection and inflammatory reaction due to other internal joint prosthesis, initial encounter: Secondary | ICD-10-CM

## 2018-08-26 DIAGNOSIS — Z96661 Presence of right artificial ankle joint: Secondary | ICD-10-CM

## 2018-08-26 DIAGNOSIS — M79671 Pain in right foot: Secondary | ICD-10-CM

## 2018-08-26 DIAGNOSIS — G8918 Other acute postprocedural pain: Secondary | ICD-10-CM

## 2018-08-26 LAB — CBC WITH DIFF
BASOPHIL #: <0.1 x10ˆ3/uL (ref <=0.20)
BASOPHIL %: 0 %
EOSINOPHIL #: <0.1 x10ˆ3/uL (ref <=0.50)
EOSINOPHIL %: 0 %
HCT: 30.7 % — ABNORMAL LOW (ref 38.9–52.0)
HGB: 10 g/dL — ABNORMAL LOW (ref 13.4–17.5)
IMMATURE GRANULOCYTE #: <0.1 x10ˆ3/uL (ref <0.10)
IMMATURE GRANULOCYTE %: 0 % (ref 0–1)
LYMPHOCYTE #: 1.68 x10ˆ3/uL (ref 1.00–4.80)
LYMPHOCYTE %: 12 %
LYMPHOCYTE %: 12 %
MCH: 28.3 pg (ref 26.0–32.0)
MCHC: 32.6 g/dL (ref 31.0–35.5)
MCV: 87 fL (ref 78.0–100.0)
MONOCYTE #: 0.82 x10ˆ3/uL (ref 0.20–1.10)
MONOCYTE %: 6 %
MPV: 9.4 fL (ref 8.7–12.5)
NEUTROPHIL #: 11.83 10*3/uL — ABNORMAL HIGH (ref 1.50–7.70)
NEUTROPHIL #: 11.83 10*3/uL — ABNORMAL HIGH (ref 1.50–7.70)
NEUTROPHIL %: 82 %
PLATELETS: 491 x10ˆ3/uL — ABNORMAL HIGH (ref 150–400)
RBC: 3.53 x10?6/uL — ABNORMAL LOW (ref 4.50–6.10)
RBC: 3.53 x10ˆ6/uL — ABNORMAL LOW (ref 4.50–6.10)
RDW-CV: 13.8 % (ref 11.5–15.5)
WBC: 14.4 10*3/uL — ABNORMAL HIGH (ref 3.7–11.0)

## 2018-08-26 LAB — PT/INR
INR: 1.17 (ref 0.80–1.20)
PROTHROMBIN TIME: 13.8 s (ref 9.5–14.1)

## 2018-08-26 SURGERY — IRRIGATION AND DEBRIDEMENT ANKLE
Anesthesia: General | Site: Ankle | Laterality: Right | Wound class: Dirty or Infected Wounds-Include old traumatic wounds

## 2018-08-26 MED ORDER — VANCOMYCIN 10 GRAM INTRAVENOUS SOLUTION
18.00 mg/kg | Freq: Two times a day (BID) | INTRAVENOUS | Status: DC
Start: 2018-08-26 — End: 2018-08-29
  Administered 2018-08-26: 0 mg via INTRAVENOUS
  Administered 2018-08-26 – 2018-08-27 (×3): 1750 mg via INTRAVENOUS
  Administered 2018-08-27: 0 mg via INTRAVENOUS
  Administered 2018-08-28: 1750 mg via INTRAVENOUS
  Administered 2018-08-28 (×2): 0 mg via INTRAVENOUS
  Administered 2018-08-28: 1750 mg via INTRAVENOUS
  Administered 2018-08-28: 0 mg via INTRAVENOUS
  Administered 2018-08-29: 10:00:00 1750 mg via INTRAVENOUS
  Administered 2018-08-29: 0 mg via INTRAVENOUS
  Filled 2018-08-26 (×7): qty 17.5

## 2018-08-26 MED ORDER — NEOSTIGMINE METHYLSULFATE 5 MG/5 ML (1 MG/ML) INTRAVENOUS SYRINGE
INJECTION | Freq: Once | INTRAVENOUS | Status: DC | PRN
Start: 2018-08-26 — End: 2018-08-26
  Administered 2018-08-26: 3 mg via INTRAVENOUS

## 2018-08-26 MED ORDER — MIDAZOLAM 1 MG/ML INJECTION SOLUTION
Freq: Once | INTRAMUSCULAR | Status: DC | PRN
Start: 2018-08-26 — End: 2018-08-26
  Administered 2018-08-26: 1 mg via INTRAVENOUS

## 2018-08-26 MED ORDER — PROPOFOL 10 MG/ML IV BOLUS
INJECTION | Freq: Once | INTRAVENOUS | Status: DC | PRN
Start: 2018-08-26 — End: 2018-08-26
  Administered 2018-08-26: 200 mg via INTRAVENOUS

## 2018-08-26 MED ORDER — KETAMINE 10 MG/ML INJECTION WRAPPER
Freq: Once | INTRAMUSCULAR | Status: DC | PRN
Start: 2018-08-26 — End: 2018-08-26
  Administered 2018-08-26 (×2): 15 mg via INTRAVENOUS
  Administered 2018-08-26 (×2): 10 mg via INTRAVENOUS

## 2018-08-26 MED ORDER — HYDROMORPHONE 2 MG/ML INJECTION SYRINGE
0.4000 mg | INJECTION | INTRAMUSCULAR | Status: AC | PRN
Start: 2018-08-26 — End: 2018-08-27
  Administered 2018-08-27 (×3): 0.4 mg via INTRAVENOUS
  Filled 2018-08-26 (×3): qty 1

## 2018-08-26 MED ORDER — DEXAMETHASONE SODIUM PHOSPHATE 4 MG/ML INJECTION SOLUTION
Freq: Once | INTRAMUSCULAR | Status: DC | PRN
Start: 2018-08-26 — End: 2018-08-26
  Administered 2018-08-26: 4 mg via INTRAVENOUS

## 2018-08-26 MED ORDER — SENNOSIDES 8.6 MG-DOCUSATE SODIUM 50 MG TABLET: 1 | Tab | Freq: Every evening | ORAL | 0 refills | 0 days | Status: DC

## 2018-08-26 MED ORDER — OXYCODONE-ACETAMINOPHEN 5 MG-325 MG TABLET
2.0000 | ORAL_TABLET | ORAL | Status: DC | PRN
Start: 2018-08-26 — End: 2018-08-29
  Administered 2018-08-27 – 2018-08-29 (×14): 2 via ORAL
  Filled 2018-08-26 (×14): qty 2

## 2018-08-26 MED ORDER — VANCOMYCIN IV - PHARMACIST TO DOSE PER PROTOCOL
Freq: Every day | Status: DC | PRN
Start: 2018-08-26 — End: 2018-08-29

## 2018-08-26 MED ORDER — FENTANYL (PF) 50 MCG/ML INJECTION SOLUTION
25.0000 ug | INTRAMUSCULAR | Status: DC | PRN
Start: 2018-08-26 — End: 2018-08-26
  Administered 2018-08-26 (×3): 25 ug via INTRAVENOUS
  Filled 2018-08-26: qty 2

## 2018-08-26 MED ORDER — CELECOXIB 200 MG CAPSULE
200.00 mg | ORAL_CAPSULE | Freq: Two times a day (BID) | ORAL | 0 refills | Status: AC
Start: 2018-08-26 — End: 2018-09-09

## 2018-08-26 MED ORDER — FENTANYL (PF) 50 MCG/ML INJECTION SOLUTION
100.0000 ug | Freq: Once | INTRAMUSCULAR | Status: DC | PRN
Start: 2018-08-26 — End: 2018-08-26
  Administered 2018-08-26: 50 ug via INTRAVENOUS
  Administered 2018-08-26: 100 ug via INTRAVENOUS
  Filled 2018-08-26: qty 2

## 2018-08-26 MED ORDER — SODIUM CHLORIDE 0.9 % (FLUSH) INJECTION SYRINGE
2.0000 mL | INJECTION | INTRAMUSCULAR | Status: DC | PRN
Start: 2018-08-26 — End: 2018-08-26

## 2018-08-26 MED ORDER — SODIUM CHLORIDE 0.9 % (FLUSH) INJECTION SYRINGE
2.0000 mL | INJECTION | Freq: Three times a day (TID) | INTRAMUSCULAR | Status: DC
Start: 2018-08-26 — End: 2018-08-26

## 2018-08-26 MED ORDER — SODIUM CHLORIDE 0.9 % (FLUSH) INJECTION SYRINGE
20.0000 mL | INJECTION | Freq: Once | INTRAMUSCULAR | Status: DC | PRN
Start: 2018-08-26 — End: 2018-08-26

## 2018-08-26 MED ORDER — LACTATED RINGERS INTRAVENOUS SOLUTION
INTRAVENOUS | Status: DC
Start: 2018-08-26 — End: 2018-08-26

## 2018-08-26 MED ORDER — FENTANYL (PF) 50 MCG/ML INJECTION SOLUTION
12.5000 ug | INTRAMUSCULAR | Status: DC | PRN
Start: 2018-08-26 — End: 2018-08-26

## 2018-08-26 MED ORDER — ROCURONIUM 10 MG/ML INTRAVENOUS SOLUTION
Freq: Once | INTRAVENOUS | Status: DC | PRN
Start: 2018-08-26 — End: 2018-08-26
  Administered 2018-08-26: 50 mg via INTRAVENOUS

## 2018-08-26 MED ORDER — VANCOMYCIN 1 GRAM/200 ML IN DEXTROSE 5 % INTRAVENOUS PIGGYBACK
1.00 g | INJECTION | Freq: Once | INTRAVENOUS | Status: DC | PRN
Start: 2018-08-26 — End: 2018-08-26

## 2018-08-26 MED ORDER — SODIUM HYPOCHLORITE 0.25 % SOLUTION
Freq: Once | Status: AC
Start: 2018-08-26 — End: 2018-08-26
  Administered 2018-08-26: 473 mL
  Filled 2018-08-26: qty 473

## 2018-08-26 MED ORDER — ENOXAPARIN 40 MG/0.4 ML SUBCUTANEOUS SYRINGE
40.00 mg | INJECTION | SUBCUTANEOUS | 0 refills | Status: AC
Start: 2018-08-26 — End: 2018-09-16

## 2018-08-26 MED ORDER — ONDANSETRON HCL (PF) 4 MG/2 ML INJECTION SOLUTION
Freq: Once | INTRAMUSCULAR | Status: DC | PRN
Start: 2018-08-26 — End: 2018-08-26
  Administered 2018-08-26: 4 mg via INTRAVENOUS

## 2018-08-26 MED ORDER — ENOXAPARIN 40 MG/0.4 ML SUBCUTANEOUS SYRINGE
40.0000 mg | INJECTION | SUBCUTANEOUS | Status: DC
Start: 2018-08-27 — End: 2018-08-29
  Administered 2018-08-27 – 2018-08-29 (×3): 40 mg via SUBCUTANEOUS
  Filled 2018-08-26 (×3): qty 0.4

## 2018-08-26 MED ORDER — SODIUM CHLORIDE 0.9 % (FLUSH) INJECTION SYRINGE
2.0000 mL | INJECTION | INTRAMUSCULAR | Status: DC | PRN
Start: 2018-08-26 — End: 2018-08-29

## 2018-08-26 MED ORDER — VANCOMYCIN 1,000 MG IV POWDER - FOR OR
1.0000 g | Freq: Once | TOPICAL | Status: DC | PRN
Start: 2018-08-26 — End: 2018-08-26
  Administered 2018-08-26: 1 g

## 2018-08-26 MED ORDER — CEFEPIME 2 GRAM SOLUTION FOR INJECTION
2.0000 g | Freq: Three times a day (TID) | INTRAMUSCULAR | Status: DC
Start: 2018-08-27 — End: 2018-08-29
  Administered 2018-08-26 – 2018-08-27 (×3): 2 g via INTRAVENOUS
  Administered 2018-08-27: 0 g via INTRAVENOUS
  Administered 2018-08-27: 2 g via INTRAVENOUS
  Administered 2018-08-27 – 2018-08-28 (×4): 0 g via INTRAVENOUS
  Administered 2018-08-28: 2 g via INTRAVENOUS
  Administered 2018-08-28: 0 g via INTRAVENOUS
  Administered 2018-08-28 – 2018-08-29 (×2): 2 g via INTRAVENOUS
  Administered 2018-08-29: 0 g via INTRAVENOUS
  Administered 2018-08-29: 2 g via INTRAVENOUS
  Administered 2018-08-29: 0 g via INTRAVENOUS
  Administered 2018-08-29: 2 g via INTRAVENOUS
  Administered 2018-08-29: 0 g via INTRAVENOUS
  Filled 2018-08-26 (×8): qty 12.5
  Filled 2018-08-26: qty 100
  Filled 2018-08-26: qty 12.5

## 2018-08-26 MED ORDER — MIDAZOLAM 1 MG/ML INJECTION SOLUTION
2.0000 mg | Freq: Once | INTRAMUSCULAR | Status: DC | PRN
Start: 2018-08-26 — End: 2018-08-26
  Administered 2018-08-26: 2 mg via INTRAVENOUS
  Filled 2018-08-26: qty 2

## 2018-08-26 MED ORDER — FENTANYL (PF) 50 MCG/ML INJECTION SOLUTION
Freq: Once | INTRAMUSCULAR | Status: DC | PRN
Start: 2018-08-26 — End: 2018-08-26
  Administered 2018-08-26: 50 ug via INTRAVENOUS
  Administered 2018-08-26: 100 ug via INTRAVENOUS
  Administered 2018-08-26: 25 ug via INTRAVENOUS

## 2018-08-26 MED ORDER — SODIUM CHLORIDE 0.9 % INTRAVENOUS SOLUTION
INTRAVENOUS | Status: DC
Start: 2018-08-26 — End: 2018-08-27
  Filled 2018-08-26: qty 110

## 2018-08-26 MED ORDER — VANCOMYCIN 10 GRAM INTRAVENOUS SOLUTION
15.00 mg/kg | Freq: Once | INTRAVENOUS | Status: AC
Start: 2018-08-26 — End: 2018-08-26
  Administered 2018-08-26: 1500 mg via INTRAVENOUS

## 2018-08-26 MED ORDER — VANCOMYCIN 1,000 MG INTRAVENOUS INJECTION
INTRAVENOUS | Status: AC
Start: 2018-08-26 — End: 2018-08-26
  Filled 2018-08-26: qty 20

## 2018-08-26 MED ORDER — OXYCODONE-ACETAMINOPHEN 5 MG-325 MG TABLET
1.0000 | ORAL_TABLET | ORAL | Status: DC | PRN
Start: 2018-08-26 — End: 2018-08-29

## 2018-08-26 MED ORDER — LIDOCAINE (PF) 100 MG/5 ML (2 %) INTRAVENOUS SYRINGE
INJECTION | Freq: Once | INTRAVENOUS | Status: DC | PRN
Start: 2018-08-26 — End: 2018-08-26
  Administered 2018-08-26: 80 mg via INTRAVENOUS
  Administered 2018-08-26: 20 mg via INTRAVENOUS

## 2018-08-26 MED ORDER — GLYCOPYRROLATE 0.2 MG/ML INJECTION SOLUTION
Freq: Once | INTRAMUSCULAR | Status: DC | PRN
Start: 2018-08-26 — End: 2018-08-26
  Administered 2018-08-26: 0.2 mg via INTRAVENOUS
  Administered 2018-08-26: 0.6 mg via INTRAVENOUS

## 2018-08-26 MED ORDER — SODIUM CHLORIDE 0.9 % IRRIGATION SOLUTION
1000.0000 mL | Status: DC | PRN
Start: 2018-08-26 — End: 2018-08-26
  Administered 2018-08-26: 1000 mL

## 2018-08-26 MED ORDER — ACETAMINOPHEN 1,000 MG/100 ML (10 MG/ML) INTRAVENOUS SOLUTION
Freq: Once | INTRAVENOUS | Status: DC | PRN
Start: 2018-08-26 — End: 2018-08-26
  Administered 2018-08-26: 1000 mg via INTRAVENOUS

## 2018-08-26 MED ORDER — CEFEPIME 2 GRAM SOLUTION FOR INJECTION
2.0000 g | Freq: Once | INTRAMUSCULAR | Status: AC
Start: 2018-08-26 — End: 2018-08-26
  Administered 2018-08-26: 0 g via INTRAVENOUS
  Administered 2018-08-26: 2 g via INTRAVENOUS
  Filled 2018-08-26: qty 12.5

## 2018-08-26 MED ORDER — SODIUM CHLORIDE 0.9 % IRRIGATION SOLUTION
3000.0000 mL | Status: DC | PRN
Start: 2018-08-26 — End: 2018-08-26
  Administered 2018-08-26 (×2): 3000 mL

## 2018-08-26 MED ORDER — OXYCODONE-ACETAMINOPHEN 5 MG-325 MG TABLET
1.0000 | ORAL_TABLET | ORAL | 0 refills | Status: DC | PRN
Start: 2018-08-26 — End: 2018-10-25

## 2018-08-26 MED ORDER — SODIUM CHLORIDE 0.9 % (FLUSH) INJECTION SYRINGE
2.0000 mL | INJECTION | Freq: Three times a day (TID) | INTRAMUSCULAR | Status: DC
Start: 2018-08-26 — End: 2018-08-29
  Administered 2018-08-26: 2 mL
  Administered 2018-08-27: 0 mL
  Administered 2018-08-27 (×2): 2 mL
  Administered 2018-08-28: 0 mL
  Administered 2018-08-28 – 2018-08-29 (×5): 2 mL

## 2018-08-26 MED ORDER — ROPIVACAINE (PF) 5 MG/ML (0.5 %) INJECTION SOLUTION
30.0000 mL | Freq: Once | INTRAMUSCULAR | Status: DC | PRN
Start: 2018-08-26 — End: 2018-08-26
  Administered 2018-08-26: 40 mL via INTRAMUSCULAR
  Filled 2018-08-26: qty 30

## 2018-08-26 MED ADMIN — valsartan 80 mg tablet: ORAL | @ 09:00:00

## 2018-08-26 MED ADMIN — sodium chloride 0.9 % intravenous solution: INTRAVENOUS | @ 09:00:00 | NDC 00338004904

## 2018-08-26 MED ADMIN — sodium chloride 0.9 % (flush) injection syringe: ORAL | @ 04:00:00

## 2018-08-26 SURGICAL SUPPLY — 52 items
ARM EXTERN FIX SIDEKICK STLTH LONG ARTC REARFOOT FXTR (ORTHOPEDICS (NOT IMPLANTS)) ×4 IMPLANT
ARM EXTERN FIX SIDEKICK STLTH XLNG ARTC REARFOOT FXTR (SURGICAL INSTRUMENTS) ×4 IMPLANT
ARM LONG ARTICULATING_RR1210 (ORTHOPEDICS (NOT IMPLANTS)) ×2
BLADE SAW 9MM OSCILLATE SGTL SS THK.51MM 31MM MED LONG PREC STRL LF  DISP (SURGICAL CUTTING SUPPLIES) IMPLANT
BLANKET MISTRAL-AIR ADULT UPR BODY 79X29.9IN FRC AIR HI VOL BLWR INTUITIVE CONTROL PNL LRG LED (MISCELLANEOUS PT CARE ITEMS) ×2 IMPLANT
BOLT EXTERN FIX SIDEKICK STLTH BRACE PLATE LOCK REARFOOT FXTR (ORTHOPEDICS (NOT IMPLANTS)) ×4 IMPLANT
CEMENT BONE PALACOS R+G ARTHPL GENT HVSC (CEMENT) ×2 IMPLANT
CONN EXTERN FIX SIDEKICK STLTH SLIDE PIN REARFOOT FXTR (ORTHOPEDICS (NOT IMPLANTS)) ×16 IMPLANT
CONV USE ITEM 337890 - PACK SURG BSIN 2 STRL LF  DISP (CUSTOM TRAYS & PACK) ×2 IMPLANT
COVER CASSETTE UNIV XRY 40X20IN (DRAPE/PACKS/SHEETS/OR TOWEL) IMPLANT
COVER WND RF DETECT STRL CLR EQP (EQUIPMENT MINOR) ×2 IMPLANT
DCNTR FLUID 3IN TRANSF STRL LF (MED) IMPLANT
DISCONTINUED NO SUB - ARM EXTERN FIX SIDEKICK STLTH LONG ARTC REARFOOT FXTR (ORTHOPEDICS (NOT IMPLANTS)) ×4 IMPLANT
DISCONTINUED NO SUB - ARM EXTERN FIX SIDEKICK STLTH XLNG ARTC REARFOOT FXTR (SURGICAL INSTRUMENTS) ×2 IMPLANT
DISCONTINUED NO SUB - BOLT EXTERN FIX SIDEKICK STLTH BRACE PLATE LOCK REARFOOT FXTR (ORTHOPEDICS (NOT IMPLANTS)) ×4 IMPLANT
DISCONTINUED NO SUB - CONN EXTERN FIX SIDEKICK STLTH SLIDE PIN REARFOOT FXTR (ORTHOPEDICS (NOT IMPLANTS)) ×8 IMPLANT
DISCONTINUED NO SUB - NUT EXTERN FIX SIDEKICK STLTH CLLT REARFOOT FXTR (ORTHOPEDICS (NOT IMPLANTS)) ×20 IMPLANT
DISCONTINUED NO SUB - NUT ORTHO SIDEKICK BRACE PLATE LOCK (ORTHOPEDICS (NOT IMPLANTS)) ×4 IMPLANT
DISCONTINUED NO SUB - NUT ORTHO SIDEKICK COMPRESS DISTRACT (ORTHOPEDICS (NOT IMPLANTS)) ×32 IMPLANT
DISCONTINUED NO SUB - PIN EXTERN FIX SIDEKICK STLTH CLLT REARFOOT FXTR (ORTHOPEDICS (NOT IMPLANTS)) ×20 IMPLANT
DISCONTINUED NO SUB - PIN TRANFIX 4MM ×8 IMPLANT
DISCONTINUED NO SUB - PLATE SIDEKICK STLTH BRACE SHORT EXTERN FIX REARFOOT FXTR (ORTHOPEDICS (NOT IMPLANTS)) ×2 IMPLANT
DISCONTINUED NO SUB - WASHER EXTERN FIX SIDEKICK STLTH PIN CONN REARFOOT FXTR (ORTHOPEDICS (NOT IMPLANTS)) ×8 IMPLANT
DRAPE FLRSCN CARM STRAP 85X54IN MINI KOVER LF  STRL EQP POLY (DRAPE/PACKS/SHEETS/OR TOWEL) ×2 IMPLANT
DRAPE FLRSCN CARM STRAP 85X54I_N MINI KOVER LF STRL EQP POLY (DRAPE/PACKS/SHEETS/OR TOWEL) ×1
DRESS WOUND 4X4IN JUMPSTART ANTIMIC ORDER IN MULTIPLES OF 10 EACH (WOUND CARE SUPPLY) ×2 IMPLANT
DRESSING JUMPSTART 4X4 (WOUND CARE/ENTEROSTOMAL SUPPLY) ×1
DRESSING XEROFORM 5 X 9IN 8884433605 50/BX 4BX/CS (WOUND CARE SUPPLY) IMPLANT
KIT COLLECT COPAN ESWAB PLASTIC REG FLOCK 1ML WHT (MICR) ×4 IMPLANT
KIT PLS LAV PLSVC + FAN SPRAY STRL LF  DISP (IRR) ×2 IMPLANT
NUT EXTERN FIX SIDEKICK STLTH CLLT REARFOOT FXTR (ORTHOPEDICS (NOT IMPLANTS)) ×20 IMPLANT
NUT ORTHO SDKCK COMPRESS DISTR_ACT (ORTHOPEDICS (NOT IMPLANTS)) ×16
NUT ORTHO SIDEKICK BRACE PLATE LOCK (ORTHOPEDICS (NOT IMPLANTS)) ×4 IMPLANT
NUT ORTHO SIDEKICK COMPRESS DISTRACT (ORTHOPEDICS (NOT IMPLANTS)) ×32 IMPLANT
OSTEOTOME 1.5INX10MM MRLND SURG LF (SURGICAL INSTRUMENTS) ×2 IMPLANT
OSTEOTOME 1.5INX6MM MRLND SURG LF (SURGICAL INSTRUMENTS) ×2 IMPLANT
PACK BASIN DBL CUSTOM (CUSTOM TRAYS & PACK) ×1
PACK SURG BSIN 2 STRL LF  DISP (CUSTOM TRAYS & PACK) ×1
PACK SURG CSTM FOOT ANKL NONST DISP LF (CUSTOM TRAYS & PACK) ×2
PACK SURG CUSTOM FOOT ANKL NONST DISP LF (CUSTOM TRAYS & PACK) ×2 IMPLANT
PIN EXTERN FIX SIDEKICK STLTH CLLT REARFOOT FXTR (ORTHOPEDICS (NOT IMPLANTS)) ×20 IMPLANT
PIN TRANFIX 4MM ×8 IMPLANT
PLATE SDKCK STLTH BRACE LONG E XTERN FIX REARFOOT FXTR - LOG786031 (ORTHOPEDICS (NOT IMPLANTS)) ×4 IMPLANT
PLATE SIDEKICK STLTH BRACE SHORT EXTERN FIX REARFOOT FXTR (ORTHOPEDICS (NOT IMPLANTS)) ×4 IMPLANT
SOL IRRG 0.9% NACL 3L PLASTIC CONTAINR UROMATIC LF (SOLUTIONS) ×2 IMPLANT
SOLUTION IRRG NS 3000CC 2B7127_4/CS (SOLUTIONS) ×2
SPLINT CAST 30X5IN SMOOTH FNSH_MOIST RST LOW EXOTHERM SPCLST (CAST) IMPLANT
SPONGE GAUZE STRL 4 X 4IN TUB_6939 1280/CS (WOUND CARE SUPPLY) ×2 IMPLANT
SUTURE 2-0 FS PROLENE 18IN BLU MONOF NONAB (SUTURE/WOUND CLOSURE) ×4 IMPLANT
SUTURE 2-0 SH VICRYL 27IN UNDYED BRD COAT ABS (SUTURE/WOUND CLOSURE) ×2 IMPLANT
SUTURE 2-0 SH VICRYL 27IN UNDY_ED BRD COAT ABS (SUTURE/WOUND CLOSURE) ×1
WASHER EXTERN FIX SIDEKICK STLTH PIN CONN REARFOOT FXTR (ORTHOPEDICS (NOT IMPLANTS)) ×8 IMPLANT

## 2018-08-26 NOTE — Nurses Notes (Signed)
Pt back from OR s/p Explant, placement of abx spacer, exfix, has nerve plexus @6ml /hr, A&Ox3, lungs clear sats 96%RA, pulses +2x4, unable to wiggle toes or feel on RLE r/t nerve block, wvac @-125 with scant bloody drng, no c/o at this time, call bell in reach, family at bedside, will cont to monitor.

## 2018-08-26 NOTE — Care Plan (Signed)
Kept NPO post midnight, OR shower/baths done, Headache relieved by PRN Tylenol, tolerable pain on RLE.     Problem: Adult Inpatient Plan of Care  Goal: Plan of Care Review  Outcome: Ongoing (see interventions/notes)  Goal: Patient-Specific Goal (Individualization)  Outcome: Ongoing (see interventions/notes)  Flowsheets  Taken 08/25/2018 0235  Individualized Care Needs: goes by "Joe"; OOB independently  Anxieties, Fears or Concerns: "I am anxious and sad about not being able to work and walk normally again for 1 1/2 year per doctor due to this infection"-per patient  Taken 08/26/2018 0511  Patient-Specific Goals (Include Timeframe): for OR today: I&D R ankle, explant hardware-Abx spacer, Ex-fix    Problem: Skin or Soft Tissue Infection  Goal: Infection Symptom Resolution  Outcome: Ongoing (see interventions/notes)     Problem: Fall Injury Risk  Goal: Absence of Fall and Fall-Related Injury  Outcome: Ongoing (see interventions/notes)     Problem: Infection  Goal: Infection Symptom Resolution  Outcome: Ongoing (see interventions/notes)

## 2018-08-26 NOTE — Consults (Signed)
PATIENT NAME: [name] NUMBER:  [id number]  DATE OF SERVICE: 08/26/2018  DATE OF BIRTH:  Jun 04, 1966    CONSULTATION    REASON FOR CONSULTATION:  Prosthetic joint infection.    REQUESTING SERVICE:  Orthopaedics.    IMPRESSION:  This is a 52 year old Hart who was admitted from orthopaedic clinic for explantation and removal of a prior total ankle arthroplasty and placement of right ankle antibiotic spacer for right ankle prosthetic joint infection.  The patient is currently doing well, and his OR cultures are showing gram-positive cocci in pairs on Gram stain, pending full culture report.  He was started on vancomycin and cefepime here on August 26, 2018, per primary team, and the patient is reporting improvement in pain and redness.  Prior to this admission, the patient was at [facility], and he received a short course of antibiotics as I will outline in the HPI below.  He was on vancomycin and ceftriaxone, and blood cultures collected on August 16, 2018, were without any growth.  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    RECOMMENDATIONS:  1. Vancomycin dose and trough per pharmacy, aim for a trough between 15 to 18.  2. Cefepime 2 g IV q.8 hours.  3. Duration of treatment is 6 weeks from last surgical intervention.  4. We will follow up on the patient.  5. Please get liver function tests today.  6. Please follow up on CBC with differential, BUN, creatinine, liver function tests, and CRP.    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  At that time,  the patient was referred to radiation oncology, and he received 25 sessions of radiation therapy highly focused between May 11, 2018, until June 14, 2018, without any complications reported.  The patient reported improvement in symptoms and pain in the right hip area since he got his radiation treatment.  He supposed to follow up with his hematologist in December 2019 with repeat PET scan around that time.  Currently, he is considered to be in remission.  The patient has past surgical history of right ankle total arthroplasty done in 2016, and after the procedure, patient had chronic right lower extremity swelling without any redness or pain since.  On August 13, 2018, the patient presented to [facility]  with acute onset right lower extremity pain and swelling with redness affecting the right ankle area.  He said he was discharged without any intervention and later on the same day he presented to [facility] where he was seen and given a prescription of clindamycin to take, which he took for 3 days.  He then re-presented on August 16, 2018, where he was admitted to [facility] and was started on vancomycin and ceftriaxone for the duration of his stay.  I called the pharmacist, and the patient, it looks like, was discharged around August 21, 2018, to follow up with his orthopedist.  He was given vancomycin 2 g IV q.12 hours on August 19, 2018, with a trough level repeated the next day of 15.2.  He also was given ceftriaxone 1 g q.12 hours from August 16, 2018, and the last dose was given on August 21, 2018.  The patient had blood cultures collected on August 16, 2018, without any growth yet.  The patient was discharged to follow up with orthopaedics here, and upon seeing the patient in the clinic, the decision was made to admit for surgical intervention.  He was admitted on August 23, 2018, and a plan was made to surgically remove the joint, which was done  on August 26, 2018, and an antibiotic spacer was placed.  He was started on vancomycin and cefepime today.  Currently, the patient is doing fine postprocedure, denying fever, chills, night sweats, headache, nausea, vomiting, diarrhea, chest pain, shortness of breath, cough.    REVIEW OF SYSTEMS:  General: Negative for fevers, chills, malaise, fatigue, appetite changes, weight loss.  No night sweats.   HEENT:  No sore throat, runny nose, sinus pain or pressure, neck stiffness or swelling.   Pulmonary: Negative for cough, sputum production. pleuritic chest pain and shortness of breath.  No wheezing.   Cardiac:  No angina.  No worsening edema.  No palpitations.   Gastrointestinal:  No abdominal pain. no nausea and no emesis.  No diarrhea.  No melena, hematochezia.   Genitourinary:  No dysuria, hematuria, urgency, frequency, hesitancy or change in baseline urinary habits.   Skin:  No new rashes or lesions.   Musculoskeletal:  No joint pains, redness, swelling, warmth.  No muscle weakness or soreness.  No new back pain.   Neurologic:  No seizures no loss consciousness.  No vertigo or dizziness.  No change in vision or hearing.  No dysarthria.  No dysphagia.  No focal weakness.   Psychiatric:  No change in mood.      PAST MEDICAL HISTORY:  1. Hyperlipidemia.  2. Hypertension.  3. Gout.  4. Plasmacytoma.    PAST SURGICAL HISTORY:  1. Tonsillectomy.  2. Knee arthroscopy.  3. Right ankle total arthroplasty in 2016.    ALLERGIES:  No known drug allergies.    SOCIAL HISTORY:  The patient is from [place], [place].  He likes to enjoy the woods and feed the deer all of the time.  He was relaxing in the woods just prior to his recent illness.  He denied smoking.  He drinks around 12 packs of beer per week.  He denied any drug use.  He denied any travel outside the country recently, but the patient is a frequent traveler in between the states and most recent travel was to [place] around 3 weeks ago.  The patient was never  exposed to tuberculosis.    FAMILY HISTORY:  Reported hypertension in the family.    VITAL SIGNS:  Blood pressure is 115/65, temperature is 36.3 degrees Celsius, oxygen saturation is 96%.    PHYSICAL EXAMINATION:  General: alert and oriented, not in acute distress  Eyes: Pupils are equal and reactive to light, extraocular muscles are intact,   ENT: no mucosal abrasions in mouth, no oral thrush, no trauma, no lesions, no wounds   NECK: no lymphadenopathy on neck examination, No JVD  Chest: clear to auscultation B/L, no Wheezes, rales or rhonchi  CVS: RRR, no Murmurs, rubs or gallops  Abdomen: soft, non tender, non distended, no HSM, normal bowel sounds  Extremities: normal range of motion, no joint swelling, no edema, clubbing or cyanosis, right ankle in a fixator   Neurological: AAOx3, no gross deficits, motor is 5/5 all  extremities, sensations intact.   SKIN: normal skin turgor, no rash or eruption  Skeletal: no muscle atrophy, no restriction of movement        MEDICATIONS:    Current Facility-Administered Medications:   .  acetaminophen (TYLENOL) tablet, 650 mg, Oral, Q4H PRN, Pam Drown, MD, 650 mg at 08/26/18 0357  .  aluminum-magnesium hydroxide-simethicone (MAALOX MAX) 400-400-40mg  per 47mL oral liquid, 5 mL, Oral, Q4H PRN, Pam Drown, MD  .  amLODIPine (NORVASC) tablet, 5 mg, Oral, Daily, Stopped at 08/26/18 0900 **AND** valsartan (DIOVAN) tablet, 80 mg, Oral, Daily, Zomcik, Alicia, PA-C, Stopped at 08/26/18 0900  .  atorvastatin (LIPITOR) tablet, 10 mg, Oral, NIGHTLY, Plumby, Mark C, MD, 10 mg at 08/26/18 1956  .  cefepime (MAXIPIME) 2 g in NS 100 mL IVPB, 2 g, Intravenous, Q8H, Plumby, Mark C, MD, Last Rate: Leonard mL/hr at 08/26/18 2325, 2 g at 08/26/18 2325  .  enoxaparin PF (LOVENOX) 40 mg/0.4 mL SubQ injection, 40 mg, Subcutaneous, Q24H, Plumby, Mark C, MD  .  HYDROmorphone (DILAUDID) 2 mg/mL injection, 0.4 mg, Intravenous, Q4H PRN, Plumby, Thana Farr, MD  .  LR premix infusion, , Intravenous,  Continuous, Pam Drown, MD, Last Rate: 75 mL/hr at 08/26/18 0110  .  INSERT & MAINTAIN PERIPHERAL IV ACCESS, , , UNTIL DISCONTINUED **AND** DRESSING CHANGE [place]; DRY STERILE, , , PRN **AND** NS flush syringe, 2 mL, Intracatheter, Q8HRS, 2 mL at 08/26/18 1956 **AND** NS flush syringe, 2-6 mL, Intracatheter, Q1 MIN PRN, Shackleford, Violet, MD  .  ondansetron (ZOFRAN) 2 mg/mL injection, 4 mg, Intravenous, Q6H PRN, Pam Drown, MD  .  oxyCODONE-acetaminophen (PERCOCET) 5-325mg  per tablet, 1 Tab, Oral, Q4H PRN **OR** oxyCODONE-acetaminophen (PERCOCET) 5-325mg  per tablet, 2 Tab, Oral, Q4H PRN, Plumby, Mark C, MD  .  pantoprazole (PROTONIX) delayed release tablet, 20 mg, Oral, Daily before Breakfast, Plumby, Thana Farr, MD, Stopped at 08/26/18 0509  .  ropivacaine PF (NAROPIN) 0.2 % in NS 275 mL (tot vol) nerve plexus infusion, , Other, Continuous, Reginia Naas, MD, Last Rate: 6 mL/hr at 08/26/18 1257  .  sennosides-docusate sodium (SENOKOT-S) 8.6-50mg  per tablet, 1 Tab, Oral, HS PRN, Pam Drown, MD  .  spironolactone (ALDACTONE) tablet, 25 mg, Oral, Daily with Breakfast, Plumby, Thana Farr, MD, Stopped at 08/26/18 0800  .  vancomycin (VANCOCIN) 1,750 mg in NS 500 mL IVPB, 18 mg/kg (Adjusted), Intravenous, Q12H, Plumby, Thana Farr, MD, Stopped at 08/26/18 2333  .  Vancomycin IV - Pharmacist to Dose per Protocol, , Does not apply, Daily PRN, Plumby, Thana Farr, MD      LABORATORY DATA:  Results for orders placed or performed during the hospital encounter of 08/23/18 (from the past 24 hour(s))   OR CULTURE(AEROBIC AND GRAM STAIN)   Result Value Ref Range    FLC Abnormal Stain (A)     GRAM STAIN 3+ Several PMNs     GRAM STAIN 2+ Few Gram Positive Cocci    OR CULTURE(AEROBIC AND GRAM STAIN)   Result Value Ref Range    FLC Abnormal Stain (A)     GRAM STAIN 3+ Several PMNs     GRAM STAIN 2+ Few Gram Positive Cocci    OR CULTURE (AEROBIC AND GRAM STAIN)   Result Value Ref Range    GRAM STAIN 1+ Rare PMNs     GRAM STAIN No  Organisms Seen    PT/INR   Result Value Ref Range    PROTHROMBIN TIME 13.8 9.5 - 14.1 seconds  INR 1.17 0.80 - 1.20    Narrative    Coumadin therapy INR range for Conventional Anticoagulation is 2.0 to 3.0 and for Intensive Anticoagulation 2.5 to 3.5.   CBC/DIFF    Narrative    The following orders were created for panel order CBC/DIFF.  Procedure                               Abnormality         Status                     ---------                               -----------         ------                     CBC WITH DIFF[[id number]]                Abnormal            Final result                 Please view results for these tests on the individual orders.   CBC WITH DIFF   Result Value Ref Range    WBC 14.4 (H) 3.7 - 11.0 x10^3/uL    RBC 3.53 (L) 4.50 - 6.10 x10^6/uL    HGB 10.0 (L) 13.4 - 17.5 g/dL    HCT 30.7 (L) 38.9 - 52.0 %    MCV 87.0 78.0 - 100.0 fL    MCH 28.3 26.0 - 32.0 pg    MCHC 32.6 31.0 - 35.5 g/dL    RDW-CV 13.8 11.5 - 15.5 %    PLATELETS 491 (H) 150 - 400 x10^3/uL    MPV 9.4 8.7 - 12.5 fL    NEUTROPHIL % 82 %    LYMPHOCYTE % 12 %    MONOCYTE % 6 %    EOSINOPHIL % 0 %    BASOPHIL % 0 %    NEUTROPHIL # 11.83 (H) 1.50 - 7.70 x10^3/uL    LYMPHOCYTE # 1.68 1.00 - 4.80 x10^3/uL    MONOCYTE # 0.82 0.20 - 1.10 x10^3/uL    EOSINOPHIL # <0.10 <=0.50 x10^3/uL    BASOPHIL # <0.10 <=0.20 x10^3/uL    IMMATURE GRANULOCYTE % 0 0 - 1 %    IMMATURE GRANULOCYTE # <0.10 <0.10 x10^3/uL         IMAGING STUDIES:  DIAGNOSTIC FLUORO   Final Result      [place] PAIN BLOCK   Final Result      NUC INFECTION IMAGING   Final Result   Focal increased radiotracer accumulation overlying the distal   tibia around the stem of the prosthesis. These findings are nonspecific,   but could relate to an infectious process in the appropriate clinical   setting. However, no underlying osseous erosion is appreciated..         XR AP MOBILE CHEST   Final Result   No acute cardiopulmonary process.               MICROBIOLOGY:  1. Blood cultures  collected from August 23, 2018, no growth to date.  2. August 26, 2018, OR cultures are showing gram-positive cocci in pairs, pending final culture report.        Scarlett Presto  Kathrine Cords, MD      Stana Bunting, MD  Professor, Section of Infectious Disease   [facility]      08/27/2018  I saw and examined the patient.  I reviewed the fellow's note.  I agree with the findings and plan of care as documented in the fellow's note.  Any exceptions/additions are edited/noted.  Add: Patient was seen and examined by Dr. Kathrine Cords and myself on Aug 26, 2018.    Stana Bunting, MD                DD:  08/26/2018 17:07:25  DT:  08/26/2018 18:00:09 [place]  D#:  [id number]

## 2018-08-26 NOTE — Progress Notes (Signed)
Orthopaedic Foot/ankle Progress Note    08/26/2018    Subjective: Resting in PACU, ready for OR today     Physical Exam:  BP (!) 141/86   Pulse 77   Temp 36.6 C (97.9 F)   Resp 15   Ht 1.829 m (6')   Wt 122.1 kg (269 lb 2.9 oz)   SpO2 96%   BMI 36.51 kg/m       NAD  Nonlabored breathing   Mood appropriate   RLE  - minimal motion of ankle  - ankle edematous  - Able to dorsi/plantar flex ankle and great toe, SILT over dorsal and plantar foot  - BCR toes     Assessment/ Plan: 52 y.o. 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  I directly supervised the resident's activities and procedures.  I reviewed the resident's note.  I agree with the findings and plan of care as documented in the resident's note.    Herbie Baltimore Kurtis Bushman, MD  Associate Professor  Chief of Foot & Ankle Surgery  Department of [place] [facility]  08/31/2018 10:45 AM

## 2018-08-26 NOTE — Care Management Notes (Signed)
[facility]  Care Management Note    Patient Name: [name]  Date of Birth: Sep 13, 1966  Sex: male  Date/Time of Admission: 08/23/2018 10:33 AM  Room/Bed: 23/A  Payor: BLUE CROSS BLUE SHIELD / Plan: [place] BC/BS PPO / Product Type: PPO /    LOS: 3 days   Primary Care Providers:  Brandon Melnick, MD, MD (General)    Admitting Diagnosis:  Cellulitis and abscess of right lower extremity [L03.115, L02.415]  Septic arthritis of ankle (CMS [facility]) [M00.9]    Assessment:      08/26/18 1615   Assessment Details   Assessment Type Continued Assessment   Date of Care Management Update 08/26/18   Date of Next DCP Update 08/29/18   Care Management Plan   Discharge Planning Status plan in progress   Projected Discharge Date 08/29/18   Discharge Needs Assessment   Discharge Facility/Level of Care Needs Home (Patient/Family Member/other)(code 1)   Transportation Available car;family or friend will provide      Pt was in the OR today for a Peripheral Block-Right femoral catheter. Pt is to start Lovenox tomorrow and a PICC line has been ordered. For now Pt will be on ABX with Lucianne Lei and Cefepine.     Discharge Plan:  Home (Patient/Family Member/other) (code 1)      The patient will continue to be evaluated for developing discharge needs.     Case Manager: Collins [place], [place]  Phone: [phone]

## 2018-08-26 NOTE — Nurses Notes (Signed)
Pt brought down to Pre-op unit via bed with 2 escort transport.

## 2018-08-26 NOTE — Anesthesia Transfer of Care (Signed)
ANESTHESIA TRANSFER OF CARE   [name] is a 52 y.o. ,male, Weight: 122.1 kg (269 lb 2.9 oz)   had Procedure(s) with comments:  IRRIGATION AND DEBRIDEMENT ANKLE  APPLICATION FIXATOR EXTERNAL - ankle  IMPLANT ANTIBIOTIC SPACER TIBIA  REMOVAL HARDWARE ANKLE  BLOCK PRE-OP FEMORAL/SCIATIC  APPLICATION WOUND VAC  performed  08/26/18   Primary Service: Hendricks Limes*    Past Medical History:   Diagnosis Date   . CPAP (continuous positive airway pressure) dependence     setting 13   . Difficult intubation    . Esophageal reflux    . Essential hypertension    . Hyperlipidemia    . Rash     rle, told he had psorasis, seen derm, given cream   . Sleep apnea       Allergy History as of 08/26/18      No Known Allergies              I completed my transfer of care / handoff to the receiving personnel during which we discussed:  Access, Airway, All key/critical aspects of case discussed, Analgesia, Antibiotics, Expectation of post procedure, Fluids/Product, Gave opportunity for questions and acknowledgement of understanding and PMHx    Post Location: PACU                                          Additional Info:Pt awake and alert.  VSS, report to RN.  Pt exchanging well on O2 via FM                      Last OR Temp: Temperature: 36.6 C (97.9 F)  ABG:  POTASSIUM   Date Value Ref Range Status   08/23/2018 4.7 3.5 - 5.1 mmol/L Final     CALCIUM   Date Value Ref Range Status   08/23/2018 9.5 8.5 - 10.2 mg/dL Final     Calculated P Axis   Date Value Ref Range Status   08/23/2018 40 degrees Final     Calculated R Axis   Date Value Ref Range Status   08/23/2018 -20 degrees Final     Calculated T Axis   Date Value Ref Range Status   08/23/2018 10 degrees Final     GLUCOSE, POINT OF CARE   Date Value Ref Range Status   10/24/2014 97 70 - 105 mg/dL Final     Comment:     Cleaned MeterNondiabeticFasting     Airway:* No LDAs found *  Blood pressure 113/79, pulse 87, temperature 36.6 C (97.9 F), resp. rate 18, height 1.829 m  (6'), weight 122.1 kg (269 lb 2.9 oz), SpO2 97 %.

## 2018-08-26 NOTE — Pharmacy Vancomycin Dosing (Signed)
[facility] / Department of Pharmaceutical Services  Therapeutic Drug Monitoring: Vancomycin  08/26/2018      Patient name: [name], [name]  Date of Birth:  09/09/1966    Actual Weight:  Weight: 122.1 kg (269 lb 2.9 oz) (08/26/18 0627)     BMI:  BMI (Calculated): 36.58 (08/26/18 7414)    Date RPh Current regimen (including mg/kg) Indication Target Levels (mcg/mL) SCr (mg/dL) CrCl* (mL/min) Measured level (mcg/mL) Plan (including when levels are due) Comments   11/8 [name] vanc 1500 mg IV intra-op at 1000 Bone/Joint 13-17 0.96 > 120  vanc 1750 mg (18 mg/kg AdjBW) Q12H starting tonight at 2200. Trough in 3 days.                                                                               *Creatinine clearance is estimated by using the Cockcroft-Gault equation for adult patients and the Carol Ada for pediatric patients.    The decision to discontinue vancomycin therapy will be determined by the primary service.  Please contact the pharmacist with any questions regarding this patient's medication regimen.

## 2018-08-26 NOTE — Nurses Notes (Signed)
Report given to OR/Pre-op RN-Lorie.

## 2018-08-26 NOTE — Consults (Signed)
[facility]  Acute Perioperative Pain Service Consult         Date of Service:  08/26/2018  [name], 52 y.o. male  Encounter Start Date:  08/23/2018  Inpatient Admission Date:  08/23/2018  Date of Birth:  1966-04-23    Type of Pain Consultation: Perioperative Pain  Consult Requested By: Dr. Irena Cords  Reason for Consult: Acute Postoperative Pain (ICD-10-G89.18)  Laterality right, Type of Surgery, right I&D ankle, Ex Fix application     Chief Complaint/Pain location: Anticipated post operative right foot pain    History of Present Illness: [name] is a 52 y.o., White male  with chief complaint as above. Anesthesiology Perioperative Pain service consulted for evaluation of anticipated post operative right foot pain. The pain is anticipated to be described as sharp post surgical pain. Pain will be located at the surgical site. Onset is anticipated to start post-operatively. Aggravating factors: movement.  Alleviating factors: pain medications.       Past Medical History:  Past Medical History:   Diagnosis Date   . CPAP (continuous positive airway pressure) dependence     setting 13   . Difficult intubation    . Esophageal reflux    . Essential hypertension    . Hyperlipidemia    . Rash     rle, told he had psorasis, seen derm, given cream   . Sleep apnea          OSA diagnosed? no  History of Anticoagulant Use? No    Social History:   Social History     Tobacco Use   . Smoking status: Never Smoker   . Smokeless tobacco: Current User     Types: Snuff   Substance Use Topics   . Alcohol use: Yes     Alcohol/week: 12.0 standard drinks     Types: 12 Cans of beer per week   . Drug use: No         ROS: Constitutional: negative for recent illnesses   Ears, nose, mouth, throat, and face: negative neck stiffness  Respiratory: negative for shortness of breath  Cardiovascular: negative for chest pain and dyspnea  Gastrointestinal: negative for nausea, vomiting  Integument/breast: negative for rash and skin  lesion  Hematologic/lymphatic: negative for easy bruising and bleeding  Musculoskeletal: negative for neck pain  Neurological: negative for numbness and tingling  Allergy: No Known Allergies      Exam:   Temperature: 36.6 C (97.9 F)  Heart Rate: 78  BP (Non-Invasive): 132/85  Respiratory Rate: 16  SpO2: 98 %  Pain Score (Numeric, Faces): 2  General: appears in good health  HENT: Head atraumatic. normocephalic, ENT without erythema or injection, mucous membranes moist.  Neck: supple, symmetrical, trachea midline.  Lungs: Non-labored breathing.  Cardiovascular: regular rate and rhythm. Appears well perfused.   Extremities: extremities normal, atraumatic, no cyanosis or edema  Skin: No rashes or lesions  Neurologic: Alert and oriented x3. No gross motor or sensory deficits in bilateral extremities.  Psychiatric: Normal affect, behavior, speech      Labs:  I have reviewed all lab results.    Plan:   [name] is a 52 y.o. male who is scheduled for  Procedure(s) (LRB):  IRRIGATION AND DEBRIDEMENT ANKLE (Right)  APPLICATION FIXATOR EXTERNAL (Right)  IMPLANT ANTIBIOTIC SPACER TIBIA (Right)  REMOVAL HARDWARE ANKLE (Right)  BLOCK PRE-OP FEMORAL/SCIATIC (Right) with anticipated post-operative pain.  - Offered patient Femoral Catheter and sciatic peripheral nerve block. Patient agreeable to procedure.   -  Scheduled and PRN medications per primary service.   - For any questions please page or call the Acute Perioperative Pain Service at [phone].   - If not already done, please place consult order to Acute Perioperative Pain Service. Thank you for your consultation.         I discussed treatment plan with patient including risks and benefits of procedures and patient consents.       Martinique Myers, DO   08/26/2018 10:54  Department of Anesthesiology  Regional Anesthesia Pager [phone]      Suggested Level of Care: Level 4      I saw and examined the patient.  I reviewed the resident's note.  I agree with the findings and plan of  care as documented in the resident's note.  Any exceptions/additions are edited/noted.    Viviana Simpler, DO

## 2018-08-26 NOTE — Progress Notes (Signed)
Orthopaedic Progress Note  [name]    Admit Date:08/23/2018  Date: 08/26/2018    DOS 11/8     Subjective: Resting in PACU    Objective:  Vitals: BP (!) 137/96   Pulse 87   Temp 36.5 C (97.7 F)   Resp (!) 23   Ht 1.829 m (6')   Wt 122.1 kg (269 lb 2.9 oz)   SpO2 96%   BMI 36.51 kg/m         NAD, resting comfortably in bed  Breathing non-labored  RLE  - exfix in place  - wound vac with good suction  - motor/sensory altered due to preop block  - BCR toes     Assessment:  Pt is a 52 y.o. 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 Plumby, MD  Dept of Orthopaedics, PGY-5  Pager [phone]  Waylan Rocher, MD 08/26/2018, 14:50    I saw and examined the patient.  I directly supervised the resident's activities and procedures.  I reviewed the resident's note.  I agree with the findings and plan of care as documented in the resident's note.    Herbie Baltimore Kurtis Bushman, MD  Associate Professor  Chief of Foot & Ankle Surgery  Department of [place] [facility]  08/31/2018 10:45 AM

## 2018-08-26 NOTE — Anesthesia Postprocedure Evaluation (Signed)
Anesthesia Post Op Evaluation    Patient: [name]  Procedure(s) with comments:  IRRIGATION AND DEBRIDEMENT ANKLE  APPLICATION FIXATOR EXTERNAL - ankle  IMPLANT ANTIBIOTIC SPACER TIBIA  REMOVAL HARDWARE ANKLE  BLOCK PRE-OP FEMORAL/SCIATIC  APPLICATION WOUND VAC    Last Vitals:Temperature: 36.3 C (97.3 F) (08/26/18 1452)  Heart Rate: 89 (08/26/18 1452)  BP (Non-Invasive): 115/65 (08/26/18 1452)  Respiratory Rate: 16 (08/26/18 1452)  SpO2: 96 % (08/26/18 1450)    Patient is sufficiently recovered from the effects of anesthesia to participate in the evaluation and has returned to their pre-procedure level.  Patient location during evaluation: PACU   Post-procedure handoff checklist completed    Patient participation: complete - patient participated  Level of consciousness: awake and alert and responsive to verbal stimuli  Pain management: adequate  Airway patency: patent  Anesthetic complications: no  Cardiovascular status: acceptable  Respiratory status: acceptable  Hydration status: acceptable  Patient post-procedure temperature: Pt Normothermic   PONV Status: Absent

## 2018-08-26 NOTE — Anesthesia Procedure Notes (Addendum)
Block: Peripheral Block-Right femoral catheter     Sedation  The patient was continuously monitored throughout the procedure and in recovery. I was in attendance and supervised the sedation (during the start and stop times listed below) and remained immediately available until the patient returned to pre-procedure baseline.  Viviana Simpler, DO 08/26/2018, 12:29  Sedation Start Time  08/26/2018 8:05 AM   Sedation Stop Time 08/26/2018 8:59 AM  Blocks  Block Type: femoral   Diagnosis: foot pain Diagnosis :acute postoperative pain  Indication: Requested by surgeon and Acute post operative pain  Laterality: Right Pt location: at Bedside  Requesting Surgeon: Fulton Reek, MD  Site verified, H&P updated and consent obtained, Patient monitors applied, Timeout performed, Emergency drugs and equipment available, Patient positioned and anesthesia consent given  Technique(See MAR for doses)  Type of Block: catheter  Approach:  Landmark      Preprocedure hand washing was performed sterile field maintained    Ultrasound used for needle placement, imaging, supervision, and interpretation. Ultrasound image archived  Nerve stimulator used :with twitch faded at the current intensity of 0.22mA  Sterile Skin Prep : Sterile gloves, Supplies assembled on sterile field, cap, Mask, Draped, Sterile drape, Sterile field established, Sterilely prepped and draped, Aseptic technique and Hand hygiene performed    Skin prepped with: Chlorhexidine gluconate    Skin Local   lidocaine 1%   Needle  Needle type: Contiplex Tuohy     Needle Gauge: 18G   Needle length: 4 in.  Needle localization: ultrasound guidance and nerve stimulator  Number of attempts: 1      Catheter          Medications    Assessment  Injection assessment: negative aspiration for heme  Paresthesia pain: none    Heart Rate changed: No    Events     Patient tolerance of procedure: tolerated well, no immediate complications        NOTES:     I saw and examined the patient.  I  reviewed the resident's note.  I agree with the findings and plan of care as documented in the resident's note.  Any exceptions/additions are edited/noted.  The patient was continuously monitored throughout the procedure and in recovery. I was in attendance and supervised the sedation (during the start and stop times listed above) and remained immediately available until the patient returned to pre-procedure baseline.     Viviana Simpler, DO            Performed By:  Emmit Pomfret Provider:  Viviana Simpler, DO  Performing Provider:  Myers, [place], DO  I was present and supervised/observed the entire procedure.  Viviana Simpler, DO 08/26/2018, 12:29

## 2018-08-26 NOTE — OR Surgeon (Signed)
[facility]  Dept of Orthopaedic Surgery                          BRIEF OPERATIVE NOTE    Patient Name: [name], [place] Number: [id number]  Date of Service: 08/26/2018   Date of Birth: 06/28/1966    Pre-Operative Diagnosis: septic right TAA   Post-Operative Diagnosis: same  Procedure(s)/Description:  Explant, placement of abx spacer, exfix  Findings: see op note     Attending Surgeon: Irena Cords  Assistant(s): Plumby    Anesthesia Type: General + block  Estimated Blood Loss:  See anesthesia note  Blood Given: none  Fluids Given: see anesthesia report  Complications (unintended/unexpected/iatrogenic/accidental/inadvertent events):  none  Wound Class: Clean Wound: Uninfected operative wounds in which no inflammation occurred    Tubes: None  Drains: incisional wound vac  Specimens/ Cultures: multiple samples sent for micro  Implants: [facility] medical exfix           Disposition: PACU - hemodynamically stable.  Condition: stable      Waylan Rocher, MD  PGY-5, Orthopaedic Surgery  08/26/18 14:46.                                                           I saw and examined the patient.  I directly supervised the resident's activities and procedures.  I reviewed the resident's note.  I agree with the findings and plan of care as documented in the resident's note.    Herbie Baltimore Kurtis Bushman, MD  Associate Professor  Chief of Foot & Ankle Surgery  Department of [place] [facility]  08/31/2018 10:45 AM

## 2018-08-26 NOTE — Anesthesia Procedure Notes (Addendum)
Block: Peripheral Block-Right sciatic single shot     Sedation  The patient was continuously monitored throughout the procedure and in recovery. I was in attendance and supervised the sedation (during the start and stop times listed below) and remained immediately available until the patient returned to pre-procedure baseline.  Viviana Simpler, DO 08/26/2018, 12:28  Sedation Start Time  08/26/2018 8:05 AM   Sedation Stop Time 08/26/2018 8:59 AM  Blocks  Block Type: popilteal fossa sciatic   Diagnosis: foot pain Diagnosis :acute postoperative pain  Indication: Requested by surgeon  Laterality: Right Pt location: at Bedside  Requesting Surgeon: Fulton Reek, MD  Site verified, H&P updated and consent obtained, Patient monitors applied, Timeout performed, Emergency drugs and equipment available, Patient positioned and anesthesia consent given  Technique(See MAR for doses)  Type of Block: single shot        Preprocedure hand washing was performed sterile field maintained    Ultrasound used for needle placement, imaging, supervision, and interpretation. Ultrasound image archived  Nerve stimulator used :with twitch faded at the current intensity of 0.15mA  Sterile Skin Prep : Large sterile sheet, Sterile gloves, Supplies assembled on sterile field, cap, Mask, Draped, Sterile technique, Sterile field established, Sterilely prepped and draped and Hand hygiene performed    Skin prepped with: Chlorhexidine gluconate    Skin Local   lidocaine 1%   Needle  Needle type: Stimuplex     Needle Gauge: 21G   Needle length: 4 in.  Needle localization: ultrasound guidance  Number of attempts: 1      Catheter          Medications    Assessment  Injection assessment: negative aspiration for heme and local visualized surrounding nerve on ultrasound  Paresthesia pain: none    Heart Rate changed: No    Events     Patient tolerance of procedure: tolerated well, no immediate complications        NOTES: Unable to obtain twitch response with  sciatic approach so moved to popliteal block with twitch and US guidance       I saw and examined the patient.  I reviewed the resident's note.  I agree with the findings and plan of care as documented in the resident's note.  Any exceptions/additions are edited/noted.  The patient was continuously monitored throughout the procedure and in recovery. I was in attendance and supervised the sedation (during the start and stop times listed above) and remained immediately available until the patient returned to pre-procedure baseline.     Viviana Simpler, DO            Performed By:  Emmit Pomfret Provider:  Viviana Simpler, DO  Performing Provider:  Myers, [place], DO  I was present and supervised/observed the entire procedure.  Viviana Simpler, DO 08/26/2018, 12:28

## 2018-08-26 NOTE — OR PreOp (Signed)
Preop nursing assessment complate

## 2018-08-26 NOTE — Anesthesia Preprocedure Evaluation (Signed)
Physical Exam:     Patient summary reviewed   Airway       Mallampati: IV    TM distance: <3 FB    Neck ROM: full  Mouth Opening: poor.  Facial hair  Beard        Dental       Dentition intact             Pulmonary    Comment: CPAP setting 13  Breath sounds clear to auscultation  (-) no rhonchi, no decreased breath sounds, no wheezes, no rales and no stridor     Cardiovascular    Rhythm: regular  Rate: Normal  (-) no friction rub, carotid bruit is not present, no peripheral edema and no murmur     Other findings          OR scheduling notified of poss difficult intubation, pt scheduled on [address].    Anesthesia Plan:  Planned anesthesia type: general        ASA 3     Intravenous induction     Anesthetic plan and risks discussed with patient.     Anesthesia issues/risks discussed are: Stroke, Intraoperative Awareness/ Recall, Cardiac Events/MI, Aspiration and PONV.    Use of blood products discussed with patient whom.     Patient's NPO status is appropriate for Anesthesia.           Plan discussed with CRNA.             EKG: N/A  CXR: N/A  Other Studies: None      Consults: medical clearance Dr. Perlie Mayo with labs, EKG and cxr    Patient instructed to take the following medications day of surgery, nexium.Copy of Anesthesia Consent provided to patient or guardian to review prior to procedure. Pt instructed that consent will be signed prior to procedure with anesthesiologist.Instructed to hold vitamins/herbs and NSAID 1 week prior to procedure.Instructed to hold ace inhibitor/ARB/Renin inhibitor 24 hours prior to surgery.    Anesthesia Complications comment: Attempted DL x2; second view with DL was grade III and ETT placed with bougie   difficult intubation cart should be present at induction       patient summary reviewed          Pulmonary   CPAP setting 13, sleep apnea and CPAP,   Cardiovascular   NYHA Classification:I  Hypertension No peripheral edema and no CAD,  Exercise Tolerance: > or = 4 METS        GI/Hepatic/Renal     GERD and well controlled     Endo/Other    morbid obesity,   no type 2 diabetes,     Neuro/Psych/MS        Cancer

## 2018-08-27 DIAGNOSIS — Z96661 Presence of right artificial ankle joint: Secondary | ICD-10-CM

## 2018-08-27 LAB — CBC WITH DIFF
BASOPHIL #: <0.1 x10ˆ3/uL (ref <=0.20)
BASOPHIL %: 0 %
EOSINOPHIL #: <0.1 x10ˆ3/uL (ref <=0.50)
EOSINOPHIL %: 0 %
HCT: 31.6 % — ABNORMAL LOW (ref 38.9–52.0)
HCT: 31.6 % — ABNORMAL LOW (ref 38.9–52.0)
HGB: 10.1 g/dL — ABNORMAL LOW (ref 13.4–17.5)
IMMATURE GRANULOCYTE #: <0.1 x10ˆ3/uL (ref <0.10)
IMMATURE GRANULOCYTE %: 1 % (ref 0–1)
LYMPHOCYTE #: 1.85 x10ˆ3/uL (ref 1.00–4.80)
LYMPHOCYTE %: 16 %
MCH: 28.1 pg (ref 26.0–32.0)
MCHC: 32 g/dL (ref 31.0–35.5)
MCV: 88 fL (ref 78.0–100.0)
MONOCYTE #: 0.81 x10?3/uL (ref 0.20–1.10)
MONOCYTE #: 0.81 x10ˆ3/uL (ref 0.20–1.10)
MONOCYTE %: 7 %
MPV: 9.3 fL (ref 8.7–12.5)
NEUTROPHIL #: 9.19 10*3/uL — ABNORMAL HIGH (ref 1.50–7.70)
NEUTROPHIL %: 76 %
PLATELETS: 473 10*3/uL — ABNORMAL HIGH (ref 150–400)
RBC: 3.59 x10ˆ6/uL — ABNORMAL LOW (ref 4.50–6.10)
RDW-CV: 13.8 % (ref 11.5–15.5)
WBC: 12 x10ˆ3/uL — ABNORMAL HIGH (ref 3.7–11.0)

## 2018-08-27 LAB — BASIC METABOLIC PANEL
ANION GAP: 5 mmol/L (ref 4–13)
ANION GAP: 8 mmol/L (ref 4–13)
BUN/CREA RATIO: 19 (ref 6–22)
BUN/CREA RATIO: 21 (ref 6–22)
BUN: 15 mg/dL (ref 8–25)
BUN: 15 mg/dL (ref 8–25)
BUN: 17 mg/dL (ref 8–25)
CALCIUM: 8.7 mg/dL (ref 8.5–10.2)
CALCIUM: 9.1 mg/dL (ref 8.5–10.2)
CHLORIDE: 102 mmol/L (ref 96–111)
CHLORIDE: 104 mmol/L (ref 96–111)
CO2 TOTAL: 25 mmol/L (ref 22–32)
CO2 TOTAL: 28 mmol/L (ref 22–32)
CREATININE: 0.81 mg/dL (ref 0.62–1.27)
CREATININE: 0.81 mg/dL (ref 0.62–1.27)
ESTIMATED GFR: >60 mL/min/{1.73_m2} (ref >60)
ESTIMATED GFR: >60 mL/min/{1.73_m2} (ref >60)
ESTIMATED GFR: >60 mL/min/{1.73_m2} (ref >60)
GLUCOSE: 101 mg/dL (ref 65–139)
GLUCOSE: 107 mg/dL (ref 65–139)
POTASSIUM: 4 mmol/L (ref 3.5–5.1)
POTASSIUM: 4.2 mmol/L (ref 3.5–5.1)
SODIUM: 135 mmol/L — ABNORMAL LOW (ref 136–145)
SODIUM: 137 mmol/L (ref 136–145)

## 2018-08-27 LAB — AST (SGOT): AST (SGOT): 22 U/L (ref 8–48)

## 2018-08-27 LAB — BILIRUBIN TOTAL: BILIRUBIN TOTAL: 0.5 mg/dL (ref 0.3–1.3)

## 2018-08-27 LAB — ALT (SGPT): ALT (SGPT): 57 U/L — ABNORMAL HIGH (ref <55)

## 2018-08-27 LAB — ALK PHOS (ALKALINE PHOSPHATASE): ALKALINE PHOSPHATASE: 79 U/L (ref 45–115)

## 2018-08-27 LAB — GAMMA GT: GGT: 39 U/L (ref 7–50)

## 2018-08-27 MED ORDER — MORPHINE 2 MG/ML INTRAVENOUS SYRINGE
2.00 mg | INJECTION | INTRAVENOUS | Status: DC | PRN
Start: 2018-08-27 — End: 2018-08-29
  Administered 2018-08-27 (×2): 2 mg via INTRAVENOUS
  Filled 2018-08-27 (×2): qty 1

## 2018-08-27 MED ORDER — SODIUM CHLORIDE 0.9 % (FLUSH) INJECTION SYRINGE
20.0000 mL | INJECTION | INTRAMUSCULAR | Status: DC | PRN
Start: 2018-08-27 — End: 2018-08-29

## 2018-08-27 MED ORDER — SODIUM CHLORIDE 0.9 % (FLUSH) INJECTION SYRINGE
10.0000 mL | INJECTION | Freq: Three times a day (TID) | INTRAMUSCULAR | Status: DC
Start: 2018-08-27 — End: 2018-08-29
  Administered 2018-08-27: 10 mL
  Administered 2018-08-28: 20 mL
  Administered 2018-08-28 (×2): 10 mL
  Administered 2018-08-29: 20 mL
  Administered 2018-08-29: 10 mL

## 2018-08-27 MED ORDER — LIDOCAINE (PF) 10 MG/ML (1 %) INJECTION SOLUTION
0.5000 mL | Freq: Once | INTRAMUSCULAR | Status: AC
Start: 2018-08-27 — End: 2018-08-27
  Administered 2018-08-27: 0.5 mL via INTRADERMAL

## 2018-08-27 MED ADMIN — sodium chloride 0.9 % intravenous solution: INTRAVENOUS | NDC 00338004904

## 2018-08-27 MED ADMIN — acetaminophen 325 mg tablet: INTRAVENOUS | @ 20:00:00

## 2018-08-27 NOTE — Progress Notes (Signed)
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 Foot not elevated, no ice on limb. Reports 9/10 burning pain on anterior incision. No change since this morning. States dilaudid works for 10 minutes and PO Percs have done nothing.    OBJECTIVE:   BP 126/77   Pulse 74   Temp 36.8 C (98.2 F)   Resp 17   Ht 1.829 m (6')   Wt 122.1 kg (269 lb 2.9 oz)   SpO2 96%   BMI 36.51 kg/m     Gen: NAD  RLE: Dressings c/d/i. (+) flex/ext of exposed digits. BCR and SILT in exposed digits.  ASSESSMENT: 52 y.o. yo male 1 Day Post-Op s/p the above procedure     PLAN:    Please elevate limb with toes above nose and Ice with large bag of ice.   PRN morphine ordered instead of breakthrough Dilaudid     Bradd Burner, MD  Resident, PGY-1  Department of Orthopaedics  Pager - [place]  08/27/2018 13:23    I counseled with the resident by phone, and/or by computer access to the patient's chart. We agreed on the treatment plan.  I reviewed the resident's note.  I agree with the findings and plan of care as documented in the resident's note.    Herbie Baltimore Kurtis Bushman, MD  Associate Professor  Chief of Foot & Ankle Surgery  Department of [place] [facility]  08/31/2018 9:20 AM

## 2018-08-27 NOTE — Progress Notes (Signed)
Orthopaedic Progress Note  [name]    Admit Date:08/23/2018  Date: 08/27/2018    DOS 11/8     Subjective: Resting in bed. Had pain last night starting around 10 pm.  Is requesting to stop IVF.    Objective:  Vitals: BP 136/70   Pulse 77   Temp 36.7 C (98.1 F)   Resp 16   Ht 1.829 m (6')   Wt 122.1 kg (269 lb 2.9 oz)   SpO2 95%   BMI 36.51 kg/m       NAD, resting comfortably in bed  Breathing non-labored  RLE  - exfix in place  - wound vac with good suction  - wiggles toes, EHL intact.  -  SILT about foot and ankle  - BCR toes     Wound vac output: 0/0/100    Assessment:  Pt is a 52 y.o. male S/P explant right septic TAA with placement of abx spacer     Plan:  Pain control  DVT ppx with lovenox  PT/OT: NWB RLE  Abx with Vanc and Cefepime   - Cultures positive for G+ Cocci in pairs  Will discuss wound vac removal with staff  ID and OPAT consulted  - ID recommends LFT's and repeat labs (ordered)  PICC line ordered   - Blood cultures negative for 3 days  OR Cultures: G+ cocci in pairs.  Dispo: pending cultures, set up for home abx  F/u Dr Irena Cords 2 wks (ordered)       Pam Drown, MD  Resident, PGY-1  Department of Orthopaedics  Pager [phone]  08/27/2018 05:55    I counseled with the resident by phone, and/or by computer access to the patient's chart. We agreed on the treatment plan.  I reviewed the resident's note.  I agree with the findings and plan of care as documented in the resident's note.    Herbie Baltimore Kurtis Bushman, MD  Associate Professor  Chief of Foot & Ankle Surgery  Department of [place] [facility]  08/31/2018 8:49 AM

## 2018-08-27 NOTE — Care Plan (Signed)
Problem: Adult Inpatient Plan of Care  Goal: Plan of Care Review  Flowsheets (Taken 08/27/2018 1246)  Plan of Care Reviewed With: patient     Problem: Adult Inpatient Plan of Care  Goal: Patient-Specific Goal (Individualization)  Flowsheets (Taken 08/27/2018 1246)  Patient-Specific Goals (Include Timeframe): Wants to have pain level below 5   Patient plan for PICC line to be placed for 6 weeks of antibiotic therapy. Patient will need Ropivacaine discontinued. Patient had joint removed and antibiotic spacer placed. Patient working with ID. Patient pain level elevated. Service notified to come and talk to him about pain control. Family at bedside. Knows he is NWB to right ankle.

## 2018-08-27 NOTE — Care Plan (Signed)
Patient is alert and oriented.  Patient has yet to be out of bed since surgery.  Patient's pain is being controlled with nerve plexus.  Patient expresses numbness in effected extremity due to ropivacain.  Patient's dressing is clean, dry, and intact.  Patient is tolerating IV antibiotics.  Patient has call bell at bedside.  Will continue to monitor patient.      Problem: Adult Inpatient Plan of Care  Goal: Plan of Care Review  Outcome: Ongoing (see interventions/notes)  Flowsheets (Taken 08/27/2018 0043)  Plan of Care Reviewed With: patient     Problem: Adult Inpatient Plan of Care  Goal: Patient-Specific Goal (Individualization)  Outcome: Ongoing (see interventions/notes)  Flowsheets (Taken 08/27/2018 0043)  Anxieties, Fears or Concerns: anxious about getting picc line.  Patient-Specific Goals (Include Timeframe): Get picc line today     Problem: Skin or Soft Tissue Infection  Goal: Infection Symptom Resolution  Outcome: Ongoing (see interventions/notes)     Problem: Fall Injury Risk  Goal: Absence of Fall and Fall-Related Injury  Outcome: Ongoing (see interventions/notes)

## 2018-08-27 NOTE — Discharge Instructions (Addendum)
PICC DISCHARGE INSTRUCTIONS      PICC Information:  5 FR Power PICC, Bard SOLO  with 2 lumen(s).  Placed in Right, Basilic Vein with tip location of Lower 1/3 of SVC.   Total Catheter length is 55cm.  External Catheter length is 6cm.  Placed on 08/27/18  Placed by Donn Pierini BSN, RN [facility]  [facility] Vascular Access Team:   Office Phone with Voicemail [phone] ext: [phone]  Pager [phone], pager [phone]     Flush Instruction:  use only a 10ML syringe; flush with a push; pause motion and 10ML saline daily    Blood Sampling Instructions  Flush 75ml saline, waste 2-5 ml, obtain specimen, flush 62ml saline and For multi-lumen line, draw specimen from distal port after stopping infusion for 2 minutes    Dressing Care Instructions:  Stat Lock securement device should be changed weekly with dressing change and as needed.  Change PICC cap/valve weekly and as needed, Use sterile technique, cover PICC and the device securement with transparent dressing at least weekly and as needed. Change first PICC dressing on DATE: 09/03/18      Peripherally Inserted Central Catheter (PICC)   Home Guide     A peripherally inserted central catheter (PICC) is a long, thin, flexible tube that is inserted into a vein in the upper arm. It is a form of intravenous (IV) access. It is considered to be a "central" line because the tip of the PICC ends in a large vein in your chest. This large vein is called the superior vena cava (SVC). The PICC tip ends in the SVC because there is a lot of blood flow in the SVC. This allows medicines and IV fluids to be quickly distributed throughout the body. The PICC is inserted using a sterile technique by a specially trained nurse or physician. After the PICC is inserted, a chest X-ray is done or a vascular positioning system is used to be sure it is in the correct place.   A PICC may be placed for different reasons, such as:   To give medicines and liquid nutrition that can only be given through a central  line. Examples are:   Certain antibiotic treatments.   Chemotherapy.   Total parenteral nutrition (TPN).   To take frequent blood samples.   To give IV fluids and blood products.   If there is difficulty placing a peripheral intravenous (PIV) catheter.  If taken care of properly, a PICC can remain in place for several months. A PICC can also allow patients to go home early. Medicine and PICC care can be managed at home by a family member or home healthcare team.   RISKS AND COMPLICATIONS   Possible problems with a PICC can occasionally occur. This may include:   A clot (thrombus) forming in or at the tip of the PICC. This can cause the PICC to become clogged. A "clot-busting" medicine called tissue plasminogen activator (tPA) can be inserted into the PICC to help break up the clot.   Inflammation of the vein (phlebitis) in which the PICC is placed. Signs of inflammation may include redness, pain at the insertion site, red streaks, or being able to feel a "cord" in the vein where the PICC is located.   Infection in the PICC or at the insertion site. Signs of infection may include fever, chills, redness, swelling, or pus drainage from the PICC insertion site.   PICC movement (malposition). The PICC tip may migrate from its original position due  to excessive physical activity, forceful coughing, sneezing, or vomiting.   A break or cut in the PICC. It is important to not use scissors near the PICC.   Nerve or tendon irritation or injury during PICC insertion.  HOME CARE INSTRUCTIONS   Activity   You may bend your arm and move it freely. If your PICC is near or at the bend of your elbow, avoid activity with repeated motion at the elbow.   Avoid lifting heavy objects as instructed by your caregiver.   Avoid using a crutch with the arm on the same side as your PICC. You may need to use a walker.  PICC Dressing   Keep your PICC bandage (dressing) clean and dry to prevent infection.   Ask your caregiver when you may shower.  Ask your caregiver to teach you how to wrap the PICC when you do take a shower.   Do not bathe, swim, or use hot tubs when you have a PICC.   Change the PICC dressing as instructed by your caregiver.   Change your PICC dressing if it becomes loose or wet.  General PICC Care   Check the PICC insertion site daily for leakage, redness, swelling, or pain.   Flush the PICC as directed by your caregiver. Let your caregiver know right away if the PICC is difficult to flush or does not flush. Do not use force to flush the PICC.   Do not use a syringe that is less than 10 mLs to flush the PICC.   Never pull or tug on the PICC.   Avoid blood pressure checks on the arm with the PICC.   Keep your PICC identification card with you at all times.   Do not take the PICC out yourself. Only a trained clinical professional should remove the PICC.  SEEK IMMEDIATE MEDICAL CARE IF:   Your PICC is accidently pulled all the way out. If this happens, cover the insertion site with a bandage or gauze dressing. Do not throw the PICC away. Your caregiver will need to inspect it.   Your PICC was tugged or pulled and has partially come out. Do not push the PICC back in.   There is any type of drainage, redness, or swelling where the PICC enters the skin.   You cannot flush the PICC, it is difficult to flush, or the PICC leaks around the insertion site when it is flushed.   You hear a "flushing" sound when the PICC is flushed.   You have pain, discomfort, or numbness in your arm, shoulder, or jaw on the same side as the PICC .   You feel your heart "racing" or skipping beats.   You notice a hole or tear in the PICC.   You develop chills or a fever.  MAKE SURE YOU:   Understand these instructions.   Will watch your condition.   Will get help right away if you are not doing well or get worse.    This information is not intended to replace advice given to you by your health care provider. Make sure you discuss any questions you have with your health  care provider.

## 2018-08-27 NOTE — Consults (Signed)
[facility]  ACUTE PAIN SERVICE PROGRESS NOTE    DATE OF SERVICE:  08/27/2018  DIAGNOSIS:  1 day s/p explant with placement of antibiotic spacer and ex fix for septic right TAA    VITALS:   Temperature: 36.8 C (98.2 F)  Heart Rate: 74  BP (Non-Invasive): 126/77  Respiratory Rate: 17  SpO2: 96 %  Pain Score (Numeric, Faces): 9    MEDICATION:  Ropivacaine 0.2%               INFUSION RATE:  6 mL/hr    CATHETER SITE:  Femoral    SITE ASSESSMENT:  clean, dry and intact    SUBJECTIVE:  Patient resting in bed. Complains of severe burning pain to right foot. No issues with catheter.     PLAN:      Femoral catheter removed at bedside today. Blue tip intact. No oozing, erythema, or pain at insertion site. Orders for catheter and medication infusion d/c'd. Please call [phone] with any questions or concerns.     We recommend addition of Gabapentin to aid in control of neuropathic pain.     Dickie La, MD  08/27/18      I saw and examined the patient.  I reviewed the resident's note.  I agree with the findings and plan of care as documented in the resident's note.  Any exceptions/additions are edited/noted.    Elias Else, MD

## 2018-08-28 LAB — ADULT ROUTINE BLOOD CULTURE, SET OF 2 BOTTLES (BACTERIA AND YEAST)
BLOOD CULTURE, ROUTINE: NO GROWTH
BLOOD CULTURE, ROUTINE: NO GROWTH

## 2018-08-28 LAB — C-REACTIVE PROTEIN(CRP),INFLAMMATION: CRP INFLAMMATION: 82.4 mg/L — ABNORMAL HIGH (ref <=8.0)

## 2018-08-28 MED ADMIN — Medication: INTRAVENOUS | @ 11:00:00

## 2018-08-28 MED ADMIN — oxyCODONE 5 mg tablet: INTRAVENOUS | @ 18:00:00

## 2018-08-28 MED ADMIN — sodium chloride 0.9 % (flush) injection syringe: INTRAVENOUS | @ 23:00:00

## 2018-08-28 MED ADMIN — lactated Ringers intravenous solution: INTRAVENOUS | @ 15:00:00

## 2018-08-28 NOTE — Care Plan (Signed)
Problem: Adult Inpatient Plan of Care  Goal: Plan of Care Review  Outcome: Ongoing (see interventions/notes)  Note:   Patient is alert and oriented, urine output adequate. Tolerating IV antibiotics well, pain is well controlled w/ PRN pain medications. Wound vac was removed today, incision on anterior right ankle WDL showing no s/sx of infection, RLE is non weight bearing status. Frequent weight shifts encouraged, plantar/dorsal flexion encouraged, and call bell within reach at all times. Fall precautions maintained. Plan is to be discharged home w/ IV antibiotic therapy administered through PICC line.      Problem: Skin or Soft Tissue Infection  Goal: Infection Symptom Resolution  Outcome: Ongoing (see interventions/notes)     Problem: Fall Injury Risk  Goal: Absence of Fall and Fall-Related Injury  Outcome: Ongoing (see interventions/notes)     Problem: Infection  Goal: Infection Symptom Resolution  Outcome: Ongoing (see interventions/notes)

## 2018-08-28 NOTE — Progress Notes (Signed)
Orthopaedic Progress Note  [name]    Admit Date:08/23/2018  Date: 08/28/2018    DOS 11/8     Subjective: Resting in bed. Pain improving. Was able to ambulate with walker.    Objective:  Vitals: BP 121/87   Pulse 91   Temp 36.4 C (97.5 F)   Resp 17   Ht 1.829 m (6')   Wt 122.1 kg (269 lb 2.9 oz)   SpO2 96%   BMI 36.51 kg/m     NAD, resting comfortably in bed  Breathing non-labored  RLE  - exfix in place  - wound vac with good suction  - wiggles toes, EHL intact.  -  SILT about foot and ankle  - BCR toes     Wound vac output: 0/25/25    Assessment:  Pt is a 52 y.o. male S/P explant right septic TAA with placement of abx spacer     Plan:  Pain control  DVT ppx with lovenox  PT/OT: NWB RLE  Abx with Vanc and Cefepime   - Cultures positive for G+ Cocci in pairs (awaiting speciation and sensitivities)  Will discuss wound vac removal with staff  ID and OPAT consulted  - LFT's show mild ALT elevation otherwise normal. CRP ordered for today  - ID recs 6 weeks vanc/cefepime  - OPAT pending  PICC line placed yesterday  Dispo: pending cultures/sensitivities/OPAT, set up for home abx  F/u Dr Irena Cords 2 wks (ordered)       Pam Drown, MD  Resident, PGY-1  Department of Orthopaedics  Pager [phone]  08/28/2018 06:45    I counseled with the resident by phone, and/or by computer access to the patient's chart. We agreed on the treatment plan.  I reviewed the resident's note.  I agree with the findings and plan of care as documented in the resident's note.    [name] Leonard Bushman, MD  Associate Professor  Chief of Foot & Ankle Surgery  [facility] [place] [facility]  08/31/2018 8:49 AM

## 2018-08-28 NOTE — Care Plan (Signed)
Patient is alert and oriented.  Patient was able to get OOB yesterday eve and walk 20 feet with walker. Pt experienced dizziness and short of breath during activity.   Patient's pain is being controlled with PRN medications.  Patient expresses numbness in effected extremity. Pt received PICC x 2 yesterday and functioning within normal limits. Pt will be scheduled for home health and ABX when discharged.  Patient's dressing is clean, dry, and intact.  Patient is tolerating IV antibiotics.  Patient has call bell at bedside.  Will continue to monitor patient.      Problem: Adult Inpatient Plan of Care  Goal: Plan of Care Review  Outcome: Ongoing (see interventions/notes)  Flowsheets (Taken 08/28/2018 0043)  Plan of Care Reviewed With: patient     Problem: Adult Inpatient Plan of Care  Goal: Patient-Specific Goal (Individualization)  Outcome: Ongoing (see interventions/notes)  Flowsheets (Taken 08/28/2018 0043)  Anxieties, Fears or Concerns: anxious about getting picc line.  Patient-Specific Goals (Include Timeframe): Get picc line today     Problem: Skin or Soft Tissue Infection  Goal: Infection Symptom Resolution  Outcome: Ongoing (see interventions/notes)     Problem: Fall Injury Risk  Goal: Absence of Fall and Fall-Related Injury  Outcome: Ongoing (see interventions/notes)

## 2018-08-28 NOTE — Consults (Signed)
[facility]  Infectious Disease Consult  Follow Up Note    [name], [name], 52 y.o. 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 Has a right PICC line in place. Feeling well overall. Denied nausea, vomiting, diarrhea, chest pain, SOB or cough.    Objective:  BP 131/80   Pulse 82   Temp 36.7 C (98.1 F)   Resp 16   Ht 1.829 m (6')   Wt 122.1 kg (269 lb 2.9 oz)   SpO2 96%   BMI 36.51 kg/m       Temp  Avg: 36.6 C (97.9 F)  Min: 36.4 C (97.5 F)  Max: 36.8 C (98.2 F)    General: alert and oriented, not in acute distress  Eyes: Pupils are equal and reactive to light, extraocular muscles are intact,   ENT: no mucosal abrasions in mouth, no oral thrush, no trauma, no lesions, no wounds,   NECK: no lymphadenopathy on neck examination, No JVD  Chest: clear to auscultation B/L, no Wheezes, rales or rhonchi  CVS: RRR, no Murmurs, rubs or gallops  Abdomen: soft, non tender, non distended, no HSM, normal bowel sounds  Extremities: normal range of motion, no joint swelling, no edema, clubbing or cyanosis  Neurological: AAOx3, no gross deficits, motor is 5/5 all extremities, sensations intact. Right ankle fixator  SKIN: normal skin turgor, no rash or eruption  Skeletal: no muscle atrophy, no restriction of movement      Current Facility-Administered Medications:   .  acetaminophen (TYLENOL) tablet, 650 mg, Oral, Q4H PRN, Pam Drown, MD, 650 mg at 08/26/18 0357  .  aluminum-magnesium hydroxide-simethicone (MAALOX MAX) 400-400-28m per 534moral liquid, 5 mL, Oral, Q4H PRN, AtPam DrownMD  .  amLODIPine (NORVASC) tablet, 5 mg, Oral, Daily, 5 mg at 08/28/18 0815 **AND** valsartan (DIOVAN) tablet, 80 mg, Oral, Daily, Zomcik, Alicia, PA-C, 80 mg at 08/28/18 0815  .  atorvastatin (LIPITOR) tablet, 10 mg, Oral, NIGHTLY, Plumby,  Mark C, MD, 10 mg at 08/27/18 2021  .  cefepime (MAXIPIME) 2 g in NS 100 mL IVPB, 2 g, Intravenous, Q8H, Plumby, MaThana FarrMD, Stopped at 08/28/18 1115  .  enoxaparin PF (LOVENOX) 40 mg/0.4 mL SubQ injection, 40 mg, Subcutaneous, Q24H, Plumby, Mark C, MD, 40 mg at 08/28/18 0816  .  morphine 2 mg/mL injection, 2 mg, Intravenous, Q3H PRN, GiClyde LundborgMD, 2 mg at 08/27/18 2025  .  INSERT & MAINTAIN PERIPHERAL IV ACCESS, , , UNTIL DISCONTINUED **AND** DRESSING CHANGE Saline Lock; DRY STERILE, , , PRN **AND** NS flush syringe, 2 mL, Intracatheter, Q8HRS, Stopped at 08/28/18 1400 **AND** NS flush syringe, 2-6 mL, Intracatheter, Q1 MIN PRN, Shackleford, Violet, MD  .  NS flush syringe, 10-30 mL, Intracatheter, Q8HRS, Santrock, RoRaeanne BarryMD, 20 mL at 08/28/18 1321  .  NS flush syringe, 20-30 mL, Intracatheter, Q1 MIN PRN, Santrock, RoRaeanne BarryMD  .  ondansetron (ZOFRAN) 2 mg/mL injection, 4 mg, Intravenous, Q6H PRN, AtPam DrownMD  .  oxyCODONE-acetaminophen (PERCOCET) 5-32536mer tablet, 1 Tab, Oral, Q4H PRN **OR** oxyCODONE-acetaminophen (PERCOCET) 5-325m35mr tablet, 2 Tab, Oral, Q4H PRN, PlumWaylan Rocher, 2 Tab at 08/28/18 1212  .  pantoprazole (PROTONIX) delayed release tablet, 20 mg, Oral, Daily before Breakfast,  Waylan Rocher, MD, 20 mg at 08/28/18 8295  .  sennosides-docusate sodium (SENOKOT-S) 8.6-50mg per tablet, 1 Tab, Oral, HS PRN, Pam Drown, MD  .  spironolactone (ALDACTONE) tablet, 25 mg, Oral, Daily with Breakfast, Plumby, Thana Farr, MD, 25 mg at 08/28/18 0816  .  vancomycin (VANCOCIN) 1,750 mg in NS 500 mL IVPB, 18 mg/kg (Adjusted), Intravenous, Q12H, Plumby, Thana Farr, MD, Stopped at 08/28/18 1225  .  Vancomycin IV - Pharmacist to Dose per Protocol, , Does not apply, Daily PRN, Plumby, Thana Farr, MD    Labs:    Lab Results Today:    Results for orders placed or performed during the hospital encounter of 08/23/18 (from the past 24 hour(s))   C-REACTIVE PROTEIN(CRP),INFLAMMATION   Result Value  Ref Range    CRP INFLAMMATION 82.4 (H) <=8.0 mg/L       Lines Drains Airways   PICC Double Lumen Lumen 1 Red Lumen 2 Purple Right;Basilic Vein Central (Active)   Lumen 1 Status flushed without difficulty;blood return present;intermittent infusion cap applied (saline/heplock) 08/28/2018  8:15 AM   Lumen 2 Status flushed without difficulty;infusing 08/28/2018  8:15 AM   Type of Fluids Infusing See MAR 08/28/2018  8:15 AM   External Position (cm) 6 cm 08/28/2018  8:15 AM   SITE ASSESSMENT WDL 08/28/2018  8:15 AM   Phlebitis Scale 0 08/28/2018  8:15 AM   Infiltration Scale 0 08/28/2018  8:15 AM   Dressing Type Transparent 08/28/2018  8:15 AM   DRESSING STATUS Clean, Dry and Intact 08/28/2018  8:15 AM   Extremity Check Fingers;Pink 08/28/2018  8:15 AM   Evaluation of Need Long term IV Therapy 08/28/2018  8:15 AM   Dressing Change Interventions Met 08/28/2018  8:15 AM   Date Dressing Changed 08/27/18 08/28/2018  8:15 AM              Microbiology:   OR cultures are pending, Gram stain was noticed earlier to show GPC in pairs     Imaging Studies:    DIAGNOSTIC FLUORO   Final Result      [place] PAIN BLOCK   Final Result      NUC INFECTION IMAGING   Final Result   Focal increased radiotracer accumulation overlying the distal   tibia around the stem of the prosthesis. These findings are nonspecific,   but could relate to an infectious process in the appropriate clinical   setting. However, no underlying osseous erosion is appreciated..         XR AP MOBILE CHEST   Final Result   No acute cardiopulmonary process.               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 His OR cultures are showing gram-positive cocci in pairs on Gram stain, but no growth to date.  He was started on vancomycin and cefepime  here on August 26, 2018, per primary team, and the patient is reporting improvement in pain and redness.  He was on vancomycin and ceftriaxone from his recent admission and blood cultures collected on August 16, 2018 were without any growth.  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    RECOMMENDATIONS:  - please continue vancomycin, dose and trough per pharmacy, Please get vancomycin trough levels  - cefepime 2g IV Q8h  - duration of treatment is 6 weeks from last surgical intervention  - follow up CBC/Diff, bun, cr, LFTs and CRP weekly  - follow up with Dr. Bobby Rumpf or Dr.Bryan  In 2-4 weeks after discharge  - Please consult OPAT    We will sign off, thank you for consultation      Beverlee Nims, MD      08/28/2018  I saw and examined the patient.  I reviewed the fellow's note.  I agree with the findings and plan of care as documented in the fellow's note.  Any exceptions/additions are edited/noted.    Stana Bunting, MD

## 2018-08-29 LAB — VANCOMYCIN, TROUGH: VANCOMYCIN TROUGH: 13 ug/mL (ref 10.0–20.0)

## 2018-08-29 LAB — CREATININE: CREATININE: 0.74 mg/dL (ref 0.62–1.27)

## 2018-08-29 LAB — CREATININE WITH EGFR: ESTIMATED GFR: >60 mL/min/1.73mˆ2 (ref >60)

## 2018-08-29 MED ORDER — POLYETHYLENE GLYCOL 3350 17 GRAM ORAL POWDER PACKET
17.00 g | Freq: Two times a day (BID) | ORAL | Status: DC
Start: 2018-08-29 — End: 2018-08-29

## 2018-08-29 MED ORDER — POLYETHYLENE GLYCOL 3350 17 GRAM ORAL POWDER PACKET
17.0000 g | Freq: Every day | ORAL | Status: DC
Start: 2018-08-29 — End: 2018-08-29
  Administered 2018-08-29: 17 g via ORAL
  Filled 2018-08-29: qty 1

## 2018-08-29 MED ADMIN — Medication: INTRAVENOUS | @ 09:00:00

## 2018-08-29 MED ADMIN — sodium chloride 0.9 % intravenous solution: ORAL | @ 09:00:00 | NDC 00338004904

## 2018-08-29 MED ADMIN — fentaNYL (PF) 2 mcg/mL-bupivacaine 0.125 %-NaCl injection solution: @ 05:00:00

## 2018-08-29 NOTE — Nurses Notes (Signed)
Patient is being discharged with lovenox injections. Patient stating, "I don't feel comfortable giving myself shots." Daughter at bedside stating she would be willing to administer medication at home for him. Instructed patient's daughter how to give SQ lovenox injection and watched her perform the injection to the patient, her father. Daughter properly administered, questions answered. Educated patient and daughter on proper needle disposal, both verbalized understanding. Both feel comfortable going home on lovenox.

## 2018-08-29 NOTE — Nurses Notes (Signed)
Care management requested IV antibiotic infusion to be administered sooner for possible discharge. Spoke with pharmacist Bre, stated okay to give now. Will administer and continue to monitor patient. Call bell in reach.

## 2018-08-29 NOTE — Care Plan (Signed)
Patient lying in bed throughout shift, family at bedside. Lovenox injection demonstrated to daughter and patient. Verbalized and demonstrated proper technique, all questions answered with no further questions. OOB with physical therapy, using scooter and a 1-2 assist. Pain being controlled with rest and po percocet. Current pain 3/0-10 in the right lower extremity ankle and leg. Getting IV antibiotics via PICC line, easily flushes with brisk blood return to lumens. Patient waiting for home health approval before discharge. Anxious to go home.   Problem: Adult Inpatient Plan of Care  Goal: Plan of Care Review  Outcome: Ongoing (see interventions/notes)  Flowsheets (Taken 08/29/2018 1316)  Plan of Care Reviewed With: patient; daughter; mother     Problem: Adult Inpatient Plan of Care  Goal: Patient-Specific Goal (Individualization)  Outcome: Ongoing (see interventions/notes)  Flowsheets  Taken 08/29/2018 1316 by Eula Listen, [place]  Individualized Care Needs: OOB with 1/2 and scooter  Anxieties, Fears or Concerns: Anxious about getting oob and ambulation upon discharge  Taken 08/29/2018 0407 by Arliss Journey, RN  Patient-Specific Goals (Include Timeframe): Get home health and ABX worked out for home use today     Problem: Venous Thromboembolism  Goal: VTE (Venous Thromboembolism) Symptom Resolution  Outcome: Ongoing (see interventions/notes)

## 2018-08-29 NOTE — Progress Notes (Signed)
Orthopaedic Progress Note  Trai Ells    Admit Date:08/23/2018  Date: 08/29/2018    DOS 11/8     Subjective: Resting in bed. No acute events overnight.  Pain well controlled.    Objective:  Vitals: BP 119/79   Pulse 74   Temp 36.6 C (97.9 F)   Resp 16   Ht 1.829 m (6')   Wt 122.1 kg (269 lb 2.9 oz)   SpO2 93%   BMI 36.51 kg/m     NAD, resting comfortably in bed  Breathing non-labored  RLE  - exfix in place  - dressing in place with no soiling.  - wiggles toes, EHL intact.  -  SILT about foot and ankle  - BCR toes. DP is under dressings and near incision     Assessment:  Pt is a 52 y.o. male S/P explant right septic TAA with placement of abx spacer     Plan:  Pain control  DVT ppx with lovenox  PT/OT: NWB RLE  Abx with Vanc and Cefepime   - Cultures positive for G+ Cocci in pairs (awaiting speciation and sensitivities)  ID and OPAT consulted  - CRP elevated 20.9=>82.4  - ID recs 6 weeks vanc/cefepime  - OPAT pending  PICC line in place  Dispo: pending cultures/sensitivities/OPAT, set up for home abx  F/u Dr Irena Cords 2 wks (ordered)       Pam Drown, MD  Resident, PGY-1  Department of Orthopaedics  Pager [phone]  08/29/2018 05:49    I counseled with the resident by phone, and/or by computer access to the patient's chart. We agreed on the treatment plan.  I reviewed the resident's note.  I agree with the findings and plan of care as documented in the resident's note.    Herbie Baltimore Kurtis Bushman, MD  Associate Professor  Chief of Foot & Ankle Surgery  Department [facility] [place] [facility]  08/31/2018 8:52 AM

## 2018-08-29 NOTE — Care Plan (Signed)
[facility]  Medical Nutrition Therapy Screen Note                                      Date of Service: 08/29/2018    Reason for Note: Nutrition Screen:  Diagnosis: wound healing    Reviewed patient status, diet order/TF/TPN, labs and medications.  Height: 182.9 cm (6')   Weight: 122.1 kg (269 lb 2.9 oz) (08/26/18 0627)   Body mass index is 36.51 kg/m.    Brief Subjective:   Visited with pt who reports a below normal appetite due to RLE pain. He has been eating 2-3 meals per day consisting of pizza and chicken wings he has received from his wife. Encouraged him to continue high protein foods at meals to promote wound healing. At home, he has been eating one meal per day with a Premier protein shake. Encouraged him to focus more on several, smaller meals per day with protein and vitamin-rich fruits and vegetables to promote wound healing. He endorses a UBW of 285 lbs but has been trying to lose weight (current weight of 269 lbs).     Most Recent Screen Previous screen   Impaired Nutrition Status Score Impaired Nutrition Status Score: 0 - Normal nutritional status (08/29/18 1600)   Impaired Nutrition Status Score: 0 - Normal nutritional status (08/29/18 1600)           Severity of Disease Score Severity of Disease Score: 1 - Chronic patients in particular with acute complications: cirrhosis, COPD, dialysis, oncology, recent bariatric surgery - Mild (08/29/18 1600)   Severity of Disease Score: 1 - Chronic patients in particular with acute complications: cirrhosis, COPD, dialysis, oncology, recent bariatric surgery - Mild (08/29/18 1600)       Age Age: 4 - < 70 years (08/29/18 1600) Age: 4 - < 70 years (08/29/18 1600)     Score Score: 1 (08/29/18 1600)     Score: 1 (08/29/18 1600)   Risk Level Risk Level: Level 1 - 0 to 1 pt - No initial note required. Follow up within 7 days (08/29/18 1600)   Risk Level: Level 1 - 0 to 1 pt - No initial note required. Follow up within  7 days (08/29/18 1600)           Nutrition related problems: increased nutrient needs to promote wound healing    Assessment: No assessment at this time    Monitor: Po status, Tolerance of diet and Upon physician consult. Pt s/p explant of prior total ankle arthroplasty, placement of antibiotic spacer, and ex-fix. Wound vac removed 11/10.    Plan/Intervention:  Encourage intake of high protein and vitamin-rich foods, fruits, and vegetables.   Continue regular diet.   Monitor weekly weights.   Would order Juven BID to promote wound healing.   Will monitor progress and make further recommendations prn.       Tiffany M. Amelia, LD 08/29/2018, 17:05  [place] Pager # [phone]                Problem: Adult Inpatient Plan of Care  Goal: Plan of Care Review  Outcome: Ongoing (see interventions/notes)     Problem: Skin Injury Risk Increased  Goal: Skin Health and Integrity  Outcome: Ongoing (see interventions/notes)  Intervention: Promote and Optimize Oral Intake  Flowsheets (Taken 08/29/2018 1700)  Oral Nutrition Promotion: calorie dense liquids provided

## 2018-08-29 NOTE — Nurses Notes (Signed)
Discharge instruction given to patient and family.  Instructed to keep follow up appointment with Dr. Irena Cords.. Med teaching on Celebrex, Lovenox, Percocet and senokot. Patient received teaching on IV antibiotics. Patient picking up antibiotics on way home. Vancomycin q 12 hrs and  and cefepime q 8 hrs. Reviewed signs and symptoms of infection and DVT. Doctor aware [place] not approved yet by insurance.Lanetta Inch with care management to follow. If unable to set up patient will have to go for lab draw on Mondays and Thursdays. patient agreeable to plan.

## 2018-08-29 NOTE — Nurses Notes (Signed)
SCDs off at this time, upon applying device patient states "I don't want those on." Educated the importance of compression device to prevent blood clots and venous stasis. Still continues to state he doesn't want them on at this time.

## 2018-08-29 NOTE — Pharmacy Vancomycin Dosing (Signed)
[facility] / Department of Pharmaceutical Services  Therapeutic Drug Monitoring: Vancomycin  08/29/2018      Patient name: [name], [name]  Date of Birth:  1965/11/01    Actual Weight:  Weight: 122.1 kg (269 lb 2.9 oz) (08/26/18 0627)     BMI:  BMI (Calculated): 36.58 (08/26/18 7943)    Date RPh Current regimen (including mg/kg) Indication Target Levels (mcg/mL) SCr (mg/dL) CrCl* (mL/min) Measured level (mcg/mL) Plan (including when levels are due) Comments   11/8 [facility] vanc 1500 mg IV intra-op at 1000 Bone/Joint 13-17 0.96 > 120  vanc 1750 mg (18 mg/kg AdjBW) Q12H starting tonight at 2200. Trough in 3 days.     11/11 [place] Vanc 1750 Q12h Right ankle joint infection 13-17 0.74 > 120 13. Drawn 1000 (~1hr late) Continue current dose. Twice weekly levels                                                                  *Creatinine clearance is estimated by using the Cockcroft-Gault equation for adult patients and the Carol Ada for pediatric patients.    The decision to discontinue vancomycin therapy will be determined by the primary service.  Please contact the pharmacist with any questions regarding this patient's medication regimen.

## 2018-08-29 NOTE — Care Plan (Signed)
Patient is alert and oriented.  Patient is OOB with one assist and walker.  Patient's pain is being controlled with prn medications.  Patient denies having any numbness or tingling.  Patient's dressing clean, dry, and intact but scant drainage around pin sites of ex fix.  Patient is tolerating IV antibiotics. Explained process of care management for home health and infusion along with ABX.  Patient has call bell at bedside.  Will continue to monitor patient.      Problem: Adult Inpatient Plan of Care  Goal: Plan of Care Review  Outcome: Ongoing (see interventions/notes)  Flowsheets (Taken 08/29/2018 0407)  Plan of Care Reviewed With: patient     Problem: Adult Inpatient Plan of Care  Goal: Patient-Specific Goal (Individualization)  Outcome: Ongoing (see interventions/notes)  Flowsheets (Taken 08/29/2018 0407)  Patient-Specific Goals (Include Timeframe): Get home health and ABX worked out for home use today     Problem: Skin or Soft Tissue Infection  Goal: Infection Symptom Resolution  Outcome: Ongoing (see interventions/notes)     Problem: Fall Injury Risk  Goal: Absence of Fall and Fall-Related Injury  Outcome: Ongoing (see interventions/notes)

## 2018-08-29 NOTE — Care Management Notes (Signed)
[facility] Management Note    Patient Name: [name]  Date of Birth: 1966-09-03  Sex: male  Date/Time of Admission: 08/23/2018 10:33 AM  Room/Bed: 23/A  Payor: BLUE CROSS BLUE SHIELD / Plan: [place] BC/BS PPO / Product Type: PPO /    LOS: 6 days   Primary Care Providers:  Brandon Melnick, MD, MD (General)    Admitting Diagnosis:  Cellulitis and abscess of right lower extremity [L03.115, L02.415]  Septic arthritis of ankle (CMS [facility]) [M00.9]    Assessment:      08/29/18 1631   Assessment Details   Assessment Type Continued Assessment   Date of Care Management Update 08/29/18   Date of Next DCP Update 09/01/18   Care Management Plan   Discharge Planning Status plan in progress   Projected Discharge Date 08/29/18   CM will evaluate for rehabilitation potential yes   Patient choice offered to patient/family yes   Form for patient choice reviewed/signed and on chart yes   Facility or Agency Preferences [facility], [facility], [facility]; [facility]   Discharge Needs Assessment   Discharge Facility/Level of Care Needs Home with Home Health (code 6);Home with Infusion Only (code 1)     Per service, pt is ready once HH and HI arranged. MSW tasked [employer] assistant to check benefits, met with pt and family at bedside to discuss discharge plan. Pt chose [facility], referral sent, pt accepted. Pt received bedside teach, will pick up medication in [place] on his way home. Pt declined by [facility] and [facility], [facility] willing to accept but can't see pt until Wed/Thursday, they are waiting on insurance auth. Plan of last resort was for pt to go for outpatient lab work/line care. Service called, said to discharge pt and follow-up with HH/outpatient labs and line care tomorrow. Updated pt and family, they are in agreement. Updated bedside nurse.    Discharge Plan:  Home Health with Infusion  [facility]    The patient will continue to be evaluated for developing discharge needs.     Case Manager: Theodoro Parma,  MSW  Phone: [phone]

## 2018-08-29 NOTE — Care Plan (Signed)
[facility]  Physical Therapy Initial Evaluation    Patient Name: [name]  Date of Birth: 27-Nov-1965  Height: Height: 182.9 cm (6')  Weight: Weight: 122.1 kg (269 lb 2.9 oz)  Room/Bed: 23/A  Payor: BLUE CROSS BLUE SHIELD / Plan: [place] BC/BS PPO / Product Type: PPO /     Assessment:      Patient was seen for an evaluation and he tolerated the session adequately.  He was able to ambulate 41ft using a FWW SBA, as well as transfer onto his knee scooter to see how it would work.  Recommend DC to home with assist once medically stable.      Discharge Needs:    Equipment Recommendation: none anticipated        Discharge Disposition: home with assist    JUSTIFICATION OF DISCHARGE RECOMMENDATION   Based on current diagnosis, functional performance prior to admission, and current functional performance, this patient requires continued PT services in home with assist in order to achieve significant functional improvements in these deficit areas: gait, locomotion, and balance.        Plan:   Current Intervention: balance training, gait training, patient/family education, ROM (range of motion), strengthening, stretching, transfer training  To provide physical therapy services 1x/day, minimum of 2x/week  for duration of until discharge.    The risks/benefits of therapy have been discussed with the patient/caregiver and he/she is in agreement with the established plan of care.       Subjective & Objective        08/29/18 1102   Therapist Pager   PT Assigned/ Pager # Josh [phone]    Rehab Session   Document Type evaluation   Total PT Minutes: 14   Patient Effort good   Symptoms Noted During/After Treatment increased pain   General Information   Patient Profile Reviewed? yes   Medical Lines PIV Line   Respiratory Status room air   Existing Precautions/Restrictions weight bearing restriction   Weight-bearing Status   Extremity Weight-bearing Status right lower extremity   Right  Lower Extremity non weight-bearing (NWB)   Mutuality/Individual Preferences   Individualized Care Needs OOB with FWW and SBA    Living Environment   Lives With spouse   Home Accessibility stairs to enter home;bed and bath on same level   Number of Stairs to [place] pt reported that he was having a ramp put in at his home and should be ready by today   Functional Level Prior   Ambulation 1 - assistive equipment   Transferring 1 - assistive equipment   Prior Functional Level Comment pt was using a FWW and knee scooter for mobilizing    Self-Care   Equipment Currently Used at Home yes   Equipment Currently Used at Home walker, rolling;crutches, axillary  (knee scooter )   Pre Treatment Status   Pre Treatment Patient Status Patient supine in bed;Call light within reach;Telephone within reach;Sitter select activated;Nurse approved session   Support Present Pre Treatment  Family present   Communication Pre Treatment  Care Management   Cognitive Assessment/Interventions   Behavior/Mood Observations behavior appropriate to situation, WNL/WFL   Attention WNL/WFL   Follows Commands WFL   Pain Assessment   Pre/Post Treatment Pain Comment pt reported his pain was controlled with meds, did not rate    RLE Assessment   RLE Assessment   (not formally assessed )   LLE Assessment   LLE  Assessment WFL- Within Functional Limits   Bed Mobility Assessment/Treatment   Bed Mobility, Assistive Device Head of Bed Elevated   Supine-Sit Independence stand-by assistance   Sit to Supine, Independence stand-by assistance   Impairments pain   Transfer Assessment/Treatment   Sit-Stand [place] stand-by assistance   Stand-Sit [place] stand-by assistance   Sit-Stand-Sit, Assist Device walker, front wheeled   Transfer Impairments balance impaired;pain   Gait Assessment/Treatment   Independence  stand-by assistance   Assistive Device  walker, front wheeled   Distance in Feet 53ft    Impairments  balance  impaired;pain   Balance Skill Training   Comment FWW   Sitting Balance: Static good balance   Sitting, Dynamic (Balance) good balance   Sit-to-Stand Balance fair + balance   Standing Balance: Static fair + balance   Standing Balance: Dynamic fair balance   Post Treatment Status   Post Treatment Patient Status Patient supine in bed;Call light within reach;Telephone within reach   Support Present Post Treatment  Family present   Communication Post Treatement Care Management   Plan of Care Review   Plan Of Care Reviewed With patient   Basic Mobility Am-PAC/6Clicks Score   Turning in bed without bedrails 4   Lying on back to sitting on edge of flat bed 3   Moving to and from a bed to a chair 3   Standing up from chair 3   Walk in room 3   Climbing 3-5 steps with railing 2   6 Clicks Raw Score total 18   Standardized (t-scale) score 41.05   CMS 0-100% Score 40.47   CMS Modifier CK   Patient Mobility Goal (JHHLM) 6- Walk 10 steps or more 2X/day   Exercise/Activity Level Performed 7- Walked 25 feet or more   Physical Therapy Clinical Impression   Assessment Patient was seen for an evaluation and he tolerated the session adequately.  He was able to ambulate 55ft using a FWW SBA, as well as transfer onto his knee scooter to see how it would work.  Recommend DC to home with assist once medically stable.     Criteria for Skilled Therapeutic yes   Impairments Found (describe specific impairments) gait, locomotion, and balance   Rehab Potential good, to achieve stated therapy goals   Therapy Frequency 1x/day;minimum of 2x/week   Predicted Duration of Therapy Intervention (days/wks) until discharge   Anticipated Equipment Needs at Discharge (PT) none anticipated   Anticipated Discharge Disposition home with assist   Evaluation Complexity Justification   Patient History: Co-morbidity/factors that impact Plan of Care Surgical procedure: causing pain &/or impaired function;One or more other medical co-morbidity   Examination  Components Balance;Bed mobility;Transfers;Ambulation   Presentation Evolving: Symptoms, complaints, characteristics of condition changing &/or cognitive deficits present   Clinical Decision Making Moderate complexity   Evaluation Complexity Moderate complexity   Care Plan Goals   PT Rehab Goals Gait Training Goal;Transfer Training Goal   Gait Training  Goal, Distance to Achieve   Gait Training  Goal, Date Established 08/29/18   Gait Training  Goal, Time to Achieve by discharge   Gait Training  Goal, Independence Level modified independence   Gait Training  Goal, Assist Device walker, rolling   Gait Training  Goal, Distance to Achieve 31ft   Transfer Training Goal   Transfer Training Goal, Date Established 08/29/18   Transfer Training Goal, Time to Achieve by discharge   Transfer Training Goal, Activity Type bed-to-chair/chair-to-bed;sit-to-stand/stand-to-sit   Transfer Training Goal, Independence Level modified independence  Transfer Training Goal, Assist Device walker, rolling   Planned Therapy Interventions, PT Eval   Planned Therapy Interventions (PT) balance training;gait training;patient/family education;ROM (range of motion);strengthening;stretching;transfer training       Therapist:   Gevena Barre, PT   Pager #: [phone]

## 2018-08-29 NOTE — Nurses Notes (Signed)
Discharged in stable condition to family via wheelchair accompanied by SA. All belongings taken

## 2018-08-29 NOTE — Consults (Signed)
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 Tobie Poet  Phone Number: [phone]  Fax Labs To: [phone]     Physician Monitoring OPAT Course:   Dr. Charyl Dancer Roidad  Phone Number: [phone]  Fax Labs To: [place]:      [place]:      SNF/nursing home/rehab          Physician to be seen for follow-up:    ID clinic 2 weeks after discharge     Follow-Up Date:   Follow-Up Time:         Attending Physician    [name], PharmD, [place]  Infectious Diseases Clinical Pharmacist  [phone]  [phone]

## 2018-08-29 NOTE — Care Plan (Signed)
pt is ready once HH and HI arranged. MSW tasked [employer] assistant to check benefits, met with pt and family at bedside to discuss discharge plan. Pt chose [facility], referral sent, pt accepted. Pt received bedside teach, will pick up medication in [place] on his way home. Pt declined by [facility] and [facility], [facility] willing to accept but can't see pt until Wed/Thursday, they are waiting on insurance auth. Plan of last resort was for pt to go for outpatient lab work/line care. Service called, said to discharge pt and follow-up with HH/outpatient labs and line care tomorrow. Updated pt and family, they are in agreement. Updated bedside nurse.

## 2018-08-30 ENCOUNTER — Other Ambulatory Visit (INDEPENDENT_AMBULATORY_CARE_PROVIDER_SITE_OTHER): Payer: Self-pay

## 2018-08-30 ENCOUNTER — Other Ambulatory Visit (HOSPITAL_BASED_OUTPATIENT_CLINIC_OR_DEPARTMENT_OTHER): Payer: Self-pay

## 2018-08-30 DIAGNOSIS — B999 Unspecified infectious disease: Secondary | ICD-10-CM

## 2018-08-30 DIAGNOSIS — L02415 Cutaneous abscess of right lower limb: Principal | ICD-10-CM

## 2018-08-30 DIAGNOSIS — L03115 Cellulitis of right lower limb: Secondary | ICD-10-CM

## 2018-08-30 LAB — OR CULTURE (AEROBIC AND GRAM STAIN)

## 2018-08-30 LAB — OR CULTURE(AEROBIC AND GRAM STAIN)

## 2018-08-31 ENCOUNTER — Telehealth (HOSPITAL_COMMUNITY): Payer: Self-pay

## 2018-08-31 ENCOUNTER — Telehealth (INDEPENDENT_AMBULATORY_CARE_PROVIDER_SITE_OTHER): Payer: Self-pay

## 2018-08-31 LAB — OR CULTURE (AEROBIC AND GRAM STAIN)
FLC: NO GROWTH
FLC: NO GROWTH

## 2018-08-31 LAB — ANAEROBIC CULTURE

## 2018-08-31 LAB — OR CULTURE(AEROBIC AND GRAM STAIN): GRAM STAIN: NONE SEEN

## 2018-08-31 NOTE — Care Management Notes (Signed)
Referral Information  ++++++ Placed Provider #1 ++++++  Case Manager: Lanetta Inch  Provider Type: Home Health  Provider Name: [facility]  Address:  [address]  [place], [place] [address]  Contact:  Intake  Phone: [phone] x  Fax:   Fax: [phone]

## 2018-08-31 NOTE — Care Management Notes (Signed)
Referral Information  ++++++ Placed Provider #1 ++++++  Case Manager: Greg Ice  Provider Type: Home Infusion  Provider Name: [facility] ([place], [place]  Address:  [address]  [place], [place] [address]  Contact: Tanya Sutton    Phone: [phone] x  Fax:   Fax: [phone]

## 2018-08-31 NOTE — Discharge Summary (Signed)
[facility]  DISCHARGE SUMMARY    PATIENT NAME:  [name], [name]  MRN:  [id number]  DOB:  1966/05/05    ENCOUNTER DATE:  08/23/2018  INPATIENT ADMISSION DATE: 08/23/2018  DISCHARGE DATE:  08/29/18    ATTENDING PHYSICIAN: Irena Cords, MD  SERVICE: ORTHO FOOT ANKLE  PRIMARY CARE PHYSICIAN: Brandon Melnick, MD         LAY CAREGIVER:  ,  ,        PRIMARY DISCHARGE DIAGNOSIS:   Active Hospital Problems    Diagnosis Date Noted   . Cellulitis and abscess of right lower extremity [L03.115, L02.415] 08/23/2018      Resolved Hospital Problems   No resolved problems to display.     Active Non-Hospital Problems    Diagnosis Date Noted   . 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      Details   celecoxib 200 mg Capsule  Commonly known as:  CELEBREX   200 mg, Oral, 2 TIMES DAILY  Qty:  28 Cap  Refills:  0     enoxaparin 40 mg/0.4 mL Syringe  Commonly known as:  LOVENOX   40 mg, Subcutaneous, EVERY 24 HOURS  Qty:  8.4 mL  Refills:  0     oxyCODONE-acetaminophen 5-325 mg Tablet  Commonly known as:  PERCOCET   1-2 Tabs, Oral, EVERY 4 HOURS PRN  Qty:  50 Tab  Refills:  0     sennosides-docusate sodium 8.6-50 mg Tablet  Commonly known as:  SENOKOT-S   1 Tab, Oral, EVERY EVENING  Qty:  30 Tab  Refills:  0        CONTINUE these medications - NO CHANGES were made during your visit.      Details   atorvastatin 10 mg Tablet  Commonly known as:  LIPITOR   10 mg, Oral, DAILY  Refills:  0     AZOR ORAL   Oral  Refills:  0     colchicine 0.6 mg Tablet   0.6 mg, Oral, DAILY  Refills:  0     esomeprazole magnesium 20 mg Capsule, Delayed Release(E.C.)  Commonly known as:  NEXIUM   20 mg, Oral, EVERY MORNING BEFORE BREAKFAST  Refills:  0     multivitamin Tablet   1 Tab, Oral, DAILY  Refills:  0     spironolactone 25 mg Tablet  Commonly known as:  ALDACTONE   25 mg, Oral, EVERY MORNING WITH BREAKFAST  Refills:  0     temazepam 15 mg Capsule  Commonly known as:   RESTORIL   15 mg, Oral, NIGHTLY PRN  Refills:  0          Discharge med list refreshed?  YES                ALLERGIES:  No Known Allergies          HOSPITAL PROCEDURE(S):   Bedside Procedures:  No orders of the defined types were placed in this encounter.    Surgical Procedure(s):  IRRIGATION AND DEBRIDEMENT ANKLE  APPLICATION FIXATOR EXTERNAL  IMPLANT ANTIBIOTIC SPACER TIBIA  REMOVAL HARDWARE ANKLE  BLOCK PRE-OP FEMORAL/SCIATIC  APPLICATION WOUND VAC    REASON FOR HOSPITALIZATION AND HOSPITAL COURSE     BRIEF HPI:  This is a 52 y.o., male admitted for R septic total ankle joint infection    [place]  NARRATIVE: patient was admitted for explant of R total ankle arthroplasty and insertion of antibiotic spacer and external fixation.  He was started on IV vanc and cefepime.  ID was consulted. Patient's OR cultures grew strep dysgalactiae, and ID recommended continuing vanc and cefepime for 6 weeks. The patient tolerated the procedure without complications. He was discharged with a PICC line in place and home health.     TRANSITION/POST DISCHARGE CARE/PENDING TESTS/REFERRALS:         CONDITION ON DISCHARGE:  A. Ambulation: Ambulation with assistive device  B. Self-care Ability: With partial assistance  C. Cognitive Status Alert and Oriented x 3  D. Code status at discharge:   Code Status Information     Code Status    Prior                 LINES/DRAINS/WOUNDS AT DISCHARGE:   Patient Lines/Drains/Airways Status    Active Line / Dialysis Catheter / Dialysis Graft / Drain / Airway / Wound     Name: Placement date: Placement time: Site: Days:    PICC Double Lumen Lumen 1 Red Lumen 2 Purple Right;[facility]  16/10/96   1600   5 FR  3                DISCHARGE DISPOSITION:  Home discharge and Home Health              DISCHARGE INSTRUCTIONS:  [facility] .    Specialty:  Orthopaedics  Contact information:  [address]  [place]  [address]  [phone]  Additional information:  For driving directions to the [facility] located within the [place] in [place], please call [phone]). You may also visit our website at [email or web address]*Valet parking is available to patients at [place] outpatient clinics for free and tipping is not required.*Visitors to our main campus will Location manager as we are expanding to better serve you. We apologize for any inconvenience this may cause and appreciate your patience.           [facility] .    Specialty:  Medicine  Contact information:  [place] [address]  [phone]                  Refer to [place]    - Weightbearing Status: None Weight Bearing Operative/injured Extremity     - What Does This Weightbearing Mean?: This means you may not bear weight on the operative or injured extremity. You should also bend/straighten your knee several times throughout the day to prevent stiffness of these joints.    - Pain Medication: The prescription pain medication will likely not be refilled for this current problem, so use it sparingly. Also, you may not operate any motorized vehicle while taking narcotic pain medication.    - For mild pain, use 2 regular-strength Tylenol pills.  - For moderate pain, take 1 prescription pain pill.  - For more severe pain, take 2 prescription pain pills.    Your prescription pain reliever contains Tylenol.  Be sure not to take more than 4000 mg of Tylenol daily. It is easy to avoid this if you use Regular Strength Tylenol instead of Extra Strength Tylenol as two Regular Strength Tylenol is 650 mg and two Extra Strength Tylenol are 1000  mg.    - Follow-up Appointments: If your surgery was elective, a follow-up appointment with your surgeon may have been scheduled prior to admission. If  not, then a follow-up appointment was ordered for you upon discharge by the doctors. If you are not contacted by your surgeon's office within two days of discharge, then please contact the orthopaedic clinic at [phone]. Also, we highly recommend that you contact your primary care doctor (family doctor) after discharge to update them on your health status.     [place]     Follow-up in: 2 WEEKS    Reason for visit: POST-OP VISIT    Follow-up reason: explant total ankle    Provider: [facility] - INFECTIOUS DISEASE - [place]    Please try to coordinate for 11/26 as patient has follow up with Dr. Irena Cords that day     Follow-up in: [place]    Reason for visit: HOSPITAL DISCHARGE    Follow-up reason: Total ankle joint infection    Provider: [place] or [facility]    Outpatient Parenteral Antimicrobial Therapy Program      Patient Name: [name]  MRN: [id number]  Patient Address: [address] [place] [address]  DOB: 22-Feb-1966  Date: 08/29/18    Outpatient Antimicrobial Treatment Plan     Diagnosis Group:  Diagnosis:   Microorganisms:  Bone and joint infection Right Ankle joint infection    GPC (OR culture)       Antibiotics  IV Antibiotics:  Dose          Frequency    Anticipated Stop date:  Comments:  Vancomycin 1750 mg IV   Every 12hours     10/07/18   Cefepime 2gm IV          Every 8 hours    10/07/18   6 weeks from I&D on 11/8     Important drug interactions:    Allergies:        Labs monitoring plan/orders  Lab Lab Frequency Additional Comments: Home Health Nurse to obtain   CBC + diff, CRP, hepatic function panel every Monday,  Creatinine, BUN, vancomycin trough every Monday and thursday        OPAT Pharmacist:  Dr. Tobie Poet  Phone Number: [phone]  Fax Labs To: [phone]    Physician Monitoring OPAT Course:   Dr. Charyl Dancer  [place]  Phone Number: [phone]  Fax Labs To: [phone]       Ht 182.9 cm    Wt 122.1 kg    Current Attending: Inda Coke    Medical Condition or Diagnosis which is primary reason for equipment: S/P explant right septic TAA with placement of abx spacer    Start Date 08/29/2018    Contact information for lab follow up ([place] Only) - Dr. Murvin Natal / Dr. Fanny Bien  Fax [phone]    Type of [place] and Dressing Change Per [place] Protocol YES    Name of Medication/Infusion, Dose, Route, Frequency, Course Duration Vancomycin '1750mg'$  IV Q12 hours. Stop date: 10/07/18.  Cefepime 2gm IV Q8 hours. Stop date: 10/07/18    Please draw the following labs Vancomycin- CBC/diff, BUN/Cr, Vancomycin level (weekly) Vanco levels every Mon and thurs   Please draw the following labs Other -  AST, ALT, ALK PHOS, TOTAL BILI (weekly)  Please draw the following labs Other- CRP (weekly)    Plan association [place] (Include name, phone & fax #s)                 Pam Drown, MD      Copies sent to Care Team       Relationship Specialty Notifications Start End    Brandon Melnick, MD PCP - General EXTERNAL  08/16/14     Phone: [phone] Fax: [phone]         [address] [place] [facility] [address]          Referring providers can utilize [email or web address] to access their referred [place] patient's information.    Outpatient Antimicrobial Treatment Plan     Diagnosis Group:  Diagnosis:   Microorganisms:   Bone and joint infection Right Ankle joint infection    GPC (OR culture)        Antibiotics  IV Antibiotics:  Dose  Frequency Anticipated Stop date:  Comments:   Vancomycin '1750mg'$  Every 12 hours 10/07/18    Cefepime 2gm Every 8 hours  10/07/18    6 weeks from I&D on 11/8     Important drug interactions:      Allergies:          Labs monitoring plan/orders  Lab Lab Frequency Additional Comments    CBC + diff, CRP, hepatic function panel every Monday,  Creatinine, BUN, vancomycin trough  every Monday and thursday         OPAT Pharmacist:  Dr. Tobie Poet  Phone Number: [phone]  Fax Labs To: [phone]     Physician Monitoring OPAT Course:   Dr. Charyl Dancer [place]  Phone Number: [phone]  Fax Labs To: [place]:      [place]:      SNF/nursing home/rehab          Physician to be seen for follow-up:    ID clinic 2 weeks after discharge     Follow-Up Date:   Follow-Up Time:         Attending Physician    [name], PharmD, [place]  Infectious Diseases Clinical Pharmacist  [place]                 Pam Drown, MD  Resident, PGY-1  Department of Orthopaedics  Pager [phone]  08/31/2018 09:49              I counseled with the resident by phone, and/or by computer access to the patient's chart. We agreed on the treatment plan.  I reviewed the resident's note.  I agree with the findings and plan of care as documented in the resident's note.    Herbie [place] Kurtis Bushman, MD  Associate Professor  Chief of Foot & Ankle Surgery  Department of [facility]  08/31/2018 10:22 AM

## 2018-08-31 NOTE — Telephone Encounter (Signed)
Spoke with Doroteo Bradford at [facility] [phone]), they got insurance auth and can accept pt. Updated service, called and updated pt at home, pt stated he is doing well.

## 2018-08-31 NOTE — Telephone Encounter (Signed)
medical question - script issue   Received: Today   Message Contents   Ra[place]olt, Apryl N  P Ortho Dr Irena Cords Service         Message from Meryl Dare sent at 08/31/2018 1:09 PM EST     Summary: medical question - script issue    [place] Pt    Pt's wife states the pharmacy informed the pt that his insurance will not cover the script for celecoxib (CELEBREX) 200 mg Oral Capsule. She is calling to see if there is another medication that can be used. Please advise. Thanks.    Preferred [facility] [phone] [place], [place]   [place] [address]   Phone: [phone] Fax: [phone]   Not a 24 hour pharmacy; exact hours not known.               Call History      Type Contact Phone User   08/31/2018 01:08 PM Phone (Incoming) Zaring,AMY (Emergency Contact) [phone] (H) Raubenolt, Apryl N     *Jordan--I have no other safe alternative for him to take. He will have to go without the celebrex. jerhodes    ------------    I called and informed the patients wife Amy of the above. She verbalized understanding and said that she would talk to [facility]. They may just pay for it out of pocket.  Martin[place] Michaels, RN  08/31/2018, 14:37

## 2018-09-01 LAB — ANAEROBIC CULTURE

## 2018-09-01 LAB — ENTER/EDIT OPAT LABS
BUN: 18
CREATININE: 0.7
VANCOMYCIN, TROUGH: 13.7

## 2018-09-02 ENCOUNTER — Encounter (INDEPENDENT_AMBULATORY_CARE_PROVIDER_SITE_OTHER): Payer: Self-pay | Admitting: Infectious Disease

## 2018-09-05 LAB — ENTER/EDIT OPAT LABS
ALKALINE PHOSPHATASE: 74
ALT (SGPT): 67
AST (SGOT): 36
BASOPHILS: 0.9
BILIRUBIN, TOTAL: 0.2
BUN: 21
C-REACTIVE PROTEIN (CRP),INFLAMMATION: 6 mg/L (ref 0–8)
CREATININE: 0.9
EOSINOPHIL: 2
HCT: 36.6
HGB: 11.7
LYMPHOCYTES: 16.7
MONOCYTES: 8
PLATELET COUNT: 411
PMN ABS: 5.9
PMN'S: 72.4
VANCOMYCIN, TROUGH: 15.1
WBC: 8.1

## 2018-09-06 ENCOUNTER — Encounter (INDEPENDENT_AMBULATORY_CARE_PROVIDER_SITE_OTHER): Payer: Self-pay | Admitting: Infectious Disease

## 2018-09-07 NOTE — OR Surgeon (Signed)
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    NAME OF PROCEDURES:  1. Irrigation debridement of right lower extremity abscess and infection multiple areas. Right lower extremity.  2. Explantation of total ankle arthroplasty, right lower extremity.  3. Hardware removal, right lower extremity.  4. Application multiplanar external fixator, right lower extremity.     This is stage I of multi-staged surgery.    SURGEON:  Fulton Reek, MD.    ASSISTANTS:  1. Waylan Rocher, MD.  2. Ervin Knack, MD.    ANESTHESIA:  Regional plus general.    DISPOSITION:  Postanesthesia recovery room stable.    INDICATION FOR PROCEDURE:  Mr. Rijo had presented to [place] with spontaneous symptoms of infection of the total ankle arthroplasty after about 3 weeks of symptoms.  We were not made aware of this prior to yesterday admission to the hospital where he presented to our clinic on that day with symptoms that had been related to a history of a new cancer treatment that suppressed his immune system in August.  He had 3 weeks of symptoms and treated with multiple antibiotics at an outside facility.  No inciting incident as far as other than excoriations of the skin that could be related to this infection and looking back through his notes, there were no signs of infection postoperatively 3-4 years ago or in his subsequent yearly followups.    DESCRIPTION OF PROCEDURE:  The patient was identified in the holding area and the operative extremity was signed by the surgeon.  Intravenous antibiotics held until we get intraoperative cultures. The right lower extremity prepped and draped in the normal  sterile fashion with Betadine scrub solution and turned to the thigh tourniquet.  Thigh tourniquet will be insufflated for the entire procedure.  A timeout was observed.  The right lower extremity approached by making the same exposure to the anterior ankle as we will be following the previous line incision.  Dissection was done down to the ankle joint implant.  There was a foul odor.  We took cultures deep into the ankle joint and multiple tissue samples were taken as well.  We then drilled out the polyethylene, removed it, and then began our work on removing the tibial talar implant.  The talar implant was lifted off with an osteotome and was removed easily, although it had been well fixed to the bone.  We then  the tip tibial tray and tried to help it to distract out the tibial stem, but this was not working and tibial tray eventually broke loose from its Morse taper leaving the stem in place.  At this point, we tried removing it through the anterior tibial notch and it still would not budge.  We therefore then used a bur and we removed the anterior cortical window until we got access of all sides of the tibial stem with the flexible osteotome.  It was then removed with success without fracturing any bone. We curetted all the surfaces here and removed the 2 screw like devices that were in the subtalar joint as well.  We did this with over drilling with the broken screw removal set.  We copiously irrigated using pulse lavage after curetting the bone meticulously debriding the bone free and we then took deeper cultures once again.  We poured Dakin solution in there as well.  Antibiotic loaded cement was then placed anteriorly into the gap and we assembled an external fixator.  Using the Stealth frame from [facility], we applied 2 transfixion pins in the tibia above the level of the window.  We then placed a transfixion pin in the calcaneus and into the talar calcaneus structure near the talar head.   This allowed [place] to assemble a bimalleolar frame, secured the frame in position, and distracted it for the antibiotic cement spacer.  We copiously irrigated once again and closed in layers.  Sterile bandages were applied.  The patient was awakened from anesthesia and taken to the recovery room stable to follow the cultures. This is a staged procedure to salvage the limb.        Fulton Reek, MD  Associate Professor   [place] Department of [place]              DD:  09/06/2018 17:17:29  DT:  09/07/2018 06:56:25 [place]  D#:  [id number]

## 2018-09-08 LAB — ENTER/EDIT OPAT LABS
BUN: 21
CREATININE: 0.8
VANCOMYCIN, TROUGH: 15

## 2018-09-09 ENCOUNTER — Encounter (INDEPENDENT_AMBULATORY_CARE_PROVIDER_SITE_OTHER): Payer: Self-pay | Admitting: Infectious Disease

## 2018-09-12 ENCOUNTER — Encounter (INDEPENDENT_AMBULATORY_CARE_PROVIDER_SITE_OTHER): Payer: Self-pay | Admitting: Infectious Disease

## 2018-09-12 LAB — ENTER/EDIT OPAT LABS
ALKALINE PHOSPHATASE: 93
ALT (SGPT): 36
AST (SGOT): 12
BASOPHILS: 0.6
BUN: 20
C-REACTIVE PROTEIN (CRP),INFLAMMATION: 35 — AB (ref 0–32)
CREATININE: 0.8
EOSINOPHIL: 2.7
HCT: 36.6
HGB: 11.7
LYMPHOCYTES: 21.7
LYMPHOCYTES: 21.7
MONOCYTES: 5.2
PLATELET COUNT: 274
PMN ABS: 4.9
PMN'S: 69.4
VANCOMYCIN, TROUGH: 15.9
WBC: 7.1

## 2018-09-13 ENCOUNTER — Encounter (HOSPITAL_BASED_OUTPATIENT_CLINIC_OR_DEPARTMENT_OTHER): Payer: Self-pay | Admitting: Infectious Disease

## 2018-09-13 ENCOUNTER — Encounter (INDEPENDENT_AMBULATORY_CARE_PROVIDER_SITE_OTHER): Payer: Self-pay

## 2018-09-13 ENCOUNTER — Ambulatory Visit (HOSPITAL_BASED_OUTPATIENT_CLINIC_OR_DEPARTMENT_OTHER): Payer: BC Managed Care – PPO | Admitting: Infectious Disease

## 2018-09-13 ENCOUNTER — Ambulatory Visit: Payer: BC Managed Care – PPO

## 2018-09-13 VITALS — BP 118/80 | HR 100 | Temp 97.7°F | Wt 258.2 lb

## 2018-09-13 DIAGNOSIS — T8459XD Infection and inflammatory reaction due to other internal joint prosthesis, subsequent encounter: Secondary | ICD-10-CM | POA: Insufficient documentation

## 2018-09-13 DIAGNOSIS — M25572 Pain in left ankle and joints of left foot: Secondary | ICD-10-CM

## 2018-09-13 DIAGNOSIS — Z792 Long term (current) use of antibiotics: Secondary | ICD-10-CM | POA: Insufficient documentation

## 2018-09-13 DIAGNOSIS — Z79899 Other long term (current) drug therapy: Secondary | ICD-10-CM | POA: Insufficient documentation

## 2018-09-13 DIAGNOSIS — T8459XA Infection and inflammatory reaction due to other internal joint prosthesis, initial encounter: Secondary | ICD-10-CM

## 2018-09-13 DIAGNOSIS — Z6835 Body mass index (BMI) 35.0-35.9, adult: Secondary | ICD-10-CM

## 2018-09-13 DIAGNOSIS — Z96661 Presence of right artificial ankle joint: Secondary | ICD-10-CM | POA: Insufficient documentation

## 2018-09-13 DIAGNOSIS — Z96669 Presence of unspecified artificial ankle joint: Secondary | ICD-10-CM

## 2018-09-13 NOTE — Progress Notes (Signed)
[facility]  [place], [place] [address]  O: [phone]  F: [phone]    [place] VISIT #1    PATIENT NAME:  [name]  DATE OF BIRTH:  [birth year]  MRN:  [id number]  DATE OF SERVICE:  09/13/2018    Date of Surgery:  08/26/18 (approx. 2 weeks post-op)  Operative Side:  Right  Name of Procedure(s):    1. Irrigation and debridement of right lower extremity abscess and infection multiple areas of right lower extremity  2. Explantation of right total ankle arthroplasty  3. Hardware removal of RLE  4. Application multiplanar external fixator    SUBJECTIVE:  [name] is a 52 y.o. male presenting to clinic today for follow-up of the above-named procedure(s).  [name] was sent to our facility following evaluation at [facility] for right ankle swelling and erythema. Patient had a history of a right total ankle arthroplasty on 11/05/14. He was taken to the operating room on 08/26/18 for an irrigation and debridement with explantation of his total ankle arthroplasty.    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     Presently, [name] reports that he is doing well. He is using a knee scooter for ambulation and remains nonweightbearing. He is tolerating antibiotics. He continues to have a RUE PICC in place and continues to follow with Infectious Disease.  [name] did not provide a pain score today.    OBJECTIVE:             Focused musculoskeletal exam of the right lower extremity:     Dressing/Splint:  Intact, without soiling. External fixator in place.   Wound Appearance:  Intact, with no signs of dehiscence.    Skin:  Normal temperature, color, and tone.    Signs of Infection:  No signs of infection.    Swelling:   Controlled edema.   Tenderness:  Not assessed.    Deformity:  No deformity.    [name]:  Not assessed.    Range of Motion:  Not assessed.     IMAGING:  No images were obtained in clinic today.      ASSESSMENT:  1. Approx. 2 week(s) status-post I&D and explantation of right total ankle arthroplasty    PLAN:  The patient is to continue IV antibiotics per ID, with a tentative end date of 10/07/18. We will schedule surgery based on their recommendations, as the patient's wound and skin seem to be healing well at this time. His staples were removed today in clinic, along with every other suture. He is to continue use of his knee scooter and nonweightbearing status.    We will follow-up with him in clinic for his H&P.    All of his questions were answered, and he appeared satisfied with the plan of treatment.  Should he have any further questions or concerns, he was advised to contact our clinic.        I am scribing for, and in the presence of, Dr. Irena Cords for services provided on 09/13/2018.  [name], SCRIBE     Vikki Ports Constance Goltz, [facility]  Department of Orthopaedics  09/13/2018 8:49 AM

## 2018-09-13 NOTE — Progress Notes (Addendum)
Orthopedic Infectious Diseases Follow-up    Reason for visit: right ankle prosthetic joint infection    HPI:   Mr. Doo is a pleasant 52 y.o. 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 Cultures from that surgery had only 1 of 3 with streptococcus, however he received multiple courses of antibiotics in preceding weeks. Thus, he was placed on IV vancomycin and IV cefepime for planned 6 weeks from 08/26/18.    His history regarding the ankle was initial surgery in 2016 but always had some pain in that ankle following initial surgery. 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 He then presented to [facility] on 08/13/18 with acute pain/redness of right ankle. He was discharged from ER and then went to [facility] where he was given PO clindamycin (?for cellulitis). He returned after 3 days with ongoing symptoms where he was admitted and started on IV vancomycin and Ceftriaxone until discharge on 08/21/18. He was referred to [place] where he was seen on 08/23/18 and admitted for right ankle washout and hardware removal. So he had antibiotics up until admission here. Blood cultures at outside hospital were negative. His OR cultures were then likely affected by the preceding antibiotics, so inpatient consult team did not narrow therapy to target strep dysgalactiae alone.    Currently he is on day 18/42 of vancomycin and cefepime, with a bump in CRP yesterday 35 (previously 6). He reports no issues with ankle, denies fevers, chills, nausea, vomiting or diarrhea. States Dr. Irena Cords said his wound/incision looked great today. He reports no issues with giving the antibiotics through PICC line, but [place] nursing unable to draw  blood through it.    Past Medical History:     Past Medical History:   Diagnosis Date   . CPAP (continuous positive airway pressure) dependence     setting 13   . Difficult intubation    . Esophageal reflux    . Essential hypertension    . Hyperlipidemia    . Plasma cell tumor (CMS HCC)    . Rash     rle, told he had psorasis, seen derm, given cream   . Sleep apnea            Past Surgical History:     Past Surgical History:   Procedure Laterality Date   . HX KNEE SURGERY      right   . HX OTHER      right ankle x3   . HX TONSILLECTOMY              Medications:     Current Outpatient Medications   Medication Sig   . AMLODIPINE BES/OLMESARTAN MED (AZOR ORAL) Take by mouth   . atorvastatin (LIPITOR) 10 mg Oral Tablet Take 10 mg by mouth Once a day   . cefepime 2 g in NS 100 mL infusion 2 g by Intravenous route Every 8 hours Mix and infuse per policy of [facility].   Marland Kitchen colchicine 0.6 mg Oral Tablet Take 0.6 mg by mouth Once a day   . enoxaparin (LOVENOX) 40 mg/0.4 mL Subcutaneous Syringe 0.4 mL (40 mg total) by Subcutaneous route Every 24 hours for 21 days   . esomeprazole magnesium (NEXIUM)  20 mg Oral Capsule, Delayed Release(E.C.) Take 20 mg by mouth Every morning before breakfast   . multivitamin Oral Tablet Take 1 Tab by mouth Once a day   . oxyCODONE-acetaminophen (PERCOCET) 5-325 mg Oral Tablet Take 1-2 Tabs by mouth Every 4 hours as needed for Pain   . sennosides-docusate sodium (SENOKOT-S) 8.6-50 mg Oral Tablet Take 1 Tab by mouth Every evening   . spironolactone (ALDACTONE) 25 mg Oral Tablet Take 25 mg by mouth Every morning with breakfast   . temazepam (RESTORIL) 15 mg Oral Capsule Take 15 mg by mouth Every night as needed for Insomnia   . vancomycin HCl (VANCOMYCIN IV) by Intravenous route Twice daily       Allergies:     Allergy History as of 09/13/18      No Known Allergies               Family History:  HTN in family    Social History:   From [place]. Denies smoking, but reports etoh about  12 beers/week. No drug use. No travel outside the country, but along neighboring states frequently. No animal exposures.      Review of Systems:   All other review of systems negative, except as documented in HPI.    Objective:   Vitals:  Blood pressure 118/80, pulse 100, temperature 36.5 C (97.7 F), temperature source Thermal Scan, weight 117.1 kg (258 lb 2.5 oz).   Body mass index is 35.01 kg/m.  General:  Pleasant, well-appearing, no acute distress  Skin:  Warm, dry, no rashes  HEENT:  EOMI, no scleral icters, mmm  Heart: regular rate and rhythm, normal S1/S2, no murmurs  Lungs: Clear to auscultation bilaterally, no wheezes  Abdomen: soft, non-tender, non-distended, normoactive bowel sounds  Musculoskeletal: right ankle with external fixator in place  Neurological: awake, alert, and oriented x3, no focal deficits  PICC in RUE: no erythema, no tenderness    Labs:   I have reviewed the labs from most recent hospitalization. In particular:  09/12/18: WBC 7.1 and CRP 35 (up from 6)      Assessment & Plan:   52yo male here for follow up after undergoing right ankle hardware explant and antibiotic spacer placement on 08/26/18 for right ankle PJI. Cultures only had rare strep dysgalactiae in 1 of 3 specimens but this was after receiving multiple antibiotic courses from 10/26-11/5 at outside facilities. For now, we will continue with IV vancomycin  1750mg  q 12 hours and IV cefepime 2gm q 8 hours. Planned end date of 10/07/18. Noted rise in CRP, will wait for repeat levels this week.    - - continue IV vancomycin 1750mg  q 12 hours (followed by OPAT)  - - continue IV cefepime 2gm q 8 hours (followed by OPAT)  - - continue weekly labs including CRP (35 yesterday, hopeful to trend back down)  - - will discuss with OPAT getting repeat labs end of this week and PICC difficulties. Too far to travel to come to our clinic for nursing troubleshooting.        Follow-up in 2-3 weeks will be arranged.    Ward Chatters,  MD  Assistant Professor, [facility] Medicine

## 2018-09-14 ENCOUNTER — Encounter (INDEPENDENT_AMBULATORY_CARE_PROVIDER_SITE_OTHER): Payer: Self-pay | Admitting: Infectious Disease

## 2018-09-14 ENCOUNTER — Other Ambulatory Visit (INDEPENDENT_AMBULATORY_CARE_PROVIDER_SITE_OTHER): Payer: Self-pay | Admitting: Infectious Disease

## 2018-09-14 DIAGNOSIS — T829XXA Unspecified complication of cardiac and vascular prosthetic device, implant and graft, initial encounter: Secondary | ICD-10-CM

## 2018-09-14 NOTE — Progress Notes (Signed)
Faxed [facility] orders to [facility] infusion [facility].    Farrel Gobble, LPN  OPAT Nurse  [id number]

## 2018-09-16 LAB — ENTER/EDIT OPAT LABS
ALKALINE PHOSPHATASE: 84
ALT (SGPT): 34
AST (SGOT): 20
BASOPHILS: 1.7
BILIRUBIN, TOTAL: 0.1
BUN: 19
CREATININE: 0.9
EOSINOPHIL: 3
HCT: 36.9
HGB: 12.2
LYMPHOCYTES: 17.3
MONOCYTES: 8.1
PLATELET COUNT: 262
PMN ABS: 4.5
PMN'S: 69.9
POTASSIUM: 4.9
VANCOMYCIN, TROUGH: 16.1
WBC: 6.4

## 2018-09-19 ENCOUNTER — Encounter (INDEPENDENT_AMBULATORY_CARE_PROVIDER_SITE_OTHER): Payer: Self-pay | Admitting: Infectious Disease

## 2018-09-22 ENCOUNTER — Encounter (INDEPENDENT_AMBULATORY_CARE_PROVIDER_SITE_OTHER): Payer: Self-pay | Admitting: Infectious Disease

## 2018-09-22 LAB — ENTER/EDIT OPAT LABS
ALKALINE PHOSPHATASE: 73
ALT (SGPT): 35
AST (SGOT): 22
BASOPHILS: 0.7
BILIRUBIN, TOTAL: 0.7
BUN: 19
C-REACTIVE PROTEIN (CRP),INFLAMMATION: <5 mg/L (ref 0–8)
CREATININE: 0.8
EOSINOPHIL: 4.6
HCT: 85.7
HGB: 37.7
LYMPHOCYTES: 18.1
MONOCYTES: 9.4
MONOCYTES: 9.4
PLATELET COUNT: 233
PMN ABS: 4.1
PMN'S: 67.2
VANCOMYCIN, TROUGH: 18
WBC: 6.1

## 2018-09-22 NOTE — Progress Notes (Signed)
Spoke to [facility] [phone]) regarding labs, will fax results.    [place]

## 2018-09-23 ENCOUNTER — Encounter (INDEPENDENT_AMBULATORY_CARE_PROVIDER_SITE_OTHER): Payer: Self-pay | Admitting: Infectious Disease

## 2018-09-23 NOTE — Progress Notes (Signed)
Outpatient Parenteral Antimicrobial Therapy (OPAT) Note    Labs 12/5 - Vancomycin trough 18, SCr 0.8  Current vancomycin dose is 1750 Q12H, doses at 10 am and 10 pm.   Above reflects true trough     Will decrease vancomycin dose to 1500 mg Q12H starting with tonight's dose  Patient is able to short infuse the current 1750 mg bags he has now to equal 1500 mg Q12H.  Updated [employer]; they will call to educated patient on how to short infuse    Charlyne Mom, PharmD  Infectious Diseases Clinical Pharmacist   Extension [phone]

## 2018-09-26 ENCOUNTER — Encounter (INDEPENDENT_AMBULATORY_CARE_PROVIDER_SITE_OTHER): Payer: Self-pay | Admitting: Infectious Disease

## 2018-09-26 ENCOUNTER — Encounter (HOSPITAL_BASED_OUTPATIENT_CLINIC_OR_DEPARTMENT_OTHER): Payer: Self-pay | Admitting: Infectious Disease

## 2018-09-26 LAB — ENTER/EDIT OPAT LABS
ALKALINE PHOSPHATASE: 68
ALT (SGPT): 24
AST (SGOT): 26
BASOPHILS: 1.2
BILIRUBIN, TOTAL: 0.5
BILIRUBIN, TOTAL: 0.5
BUN: 23
CREATININE: 0.9
EOSINOPHIL: 3.7
HCT: 37.3
HGB: 12.5
LYMPHOCYTES: 17.9
MONOCYTES: 9
PLATELET COUNT: 223
PMN ABS: 4.3
PMN'S: 68.2
VANCOMYCIN, TROUGH: 18.6
WBC: 6.2

## 2018-09-26 NOTE — Progress Notes (Signed)
Outpatient Parenteral Antimicrobial Therapy (OPAT) Note    Labs 12/9 - vancomycin trough 18.6  Current vancomycin dose is 1500 mg every 12 hours , doses at 10 am and 10 pm  Patient states he missed a dose on Saturday     Will reduce dose to vancomycin 1250 mg every 12 hours.  Updated patient .  Patient states he has been short infusing his 1750 mg bags of vancomycin as [employer] instructed him to do so. He infuses a 1750 mg bag for 1 hour and 45 minutes, which should equal ~1500 mg of vancomycin. States he will shorten infusion time by an additional 15 minutes starting tonight.   Will update [employer] in the AM of dose change so they can deliver the new dose of 1250 mg.     Charlyne Mom, PharmD, [place]  Infectious Diseases Clinical Pharmacist   Extension [phone]

## 2018-10-03 LAB — ENTER/EDIT OPAT LABS
ALKALINE PHOSPHATASE: 81
ALT (SGPT): 21
AST (SGOT): 23
BASOPHILS: 0.8
BILIRUBIN, TOTAL: 0.5
BUN: 18
C-REACTIVE PROTEIN (CRP),INFLAMMATION: 5 mg/L (ref 0–8)
CREATININE: 0.8
EOSINOPHIL: 2.8
HCT: 37.8
HGB: 12.5
LYMPHOCYTES: 16.4
MONOCYTES: 7.5
MONOCYTES: 7.5
PLATELET COUNT: 269
PMN ABS: 5.1
PMN'S: 72.5
VANCOMYCIN, TROUGH: 14.2
WBC: 7

## 2018-10-04 ENCOUNTER — Encounter (INDEPENDENT_AMBULATORY_CARE_PROVIDER_SITE_OTHER): Payer: Self-pay

## 2018-10-04 ENCOUNTER — Encounter (INDEPENDENT_AMBULATORY_CARE_PROVIDER_SITE_OTHER): Payer: Self-pay | Admitting: Infectious Disease

## 2018-10-04 NOTE — Progress Notes (Signed)
[facility]  [place], [place] [address]  O: [phone]  F: [phone]    ORTHOPAEDIC FOOT & ANKLE SURGERY  PRE-OPERATIVE SURGICAL PLAN    PATIENT NAME:  [name]  DATE OF BIRTH:  1966/02/01  MRN:  [id number]  DATE OF SERVICE:  10/04/2018     Indication(s) for surgery (diagnosis):   1. Infected R TAA   Conservative measures (and how long?):  Several weeks. IV antibiotics after surgery.   Screening tool abnormalities:    None   Planned procedure(s):   1. R TTC fusion   Operative side:  Right   Planned admission status:  23 hours obs   Planned weight bearing status:    NWB    Estimated surgical time:  3 hours   Anesthesia type:  GA + Block    Position: Supine    Fluoroscopy type:  Large   Equipment & implants:    VALOR nail with frozen femoral head + Augment + Prostim   Planned bone stimulator:  Yes   Special surgery notes:   None   Special H&P notes:    Medical clearance    Albumin    ESR   CRP   CBC w/ diff   Planned follow-up:  < 1 week   Pre-operative CPT codes:   1. 17616  2. 76000      Katherina Mires, SCRIBE  Foot & Ankle Surgery  Department of Orthopaedics  10/04/2018 3:27 PM

## 2018-10-06 ENCOUNTER — Ambulatory Visit (INDEPENDENT_AMBULATORY_CARE_PROVIDER_SITE_OTHER): Payer: Self-pay | Admitting: Infectious Disease

## 2018-10-06 ENCOUNTER — Encounter (INDEPENDENT_AMBULATORY_CARE_PROVIDER_SITE_OTHER): Payer: Self-pay | Admitting: Infectious Disease

## 2018-10-06 LAB — ENTER/EDIT OPAT LABS
BUN: 16
CREATININE: 0.7
VANCOMYCIN, TROUGH: 14.4

## 2018-10-06 NOTE — Telephone Encounter (Signed)
Returned call to [facility], gave TO to DC PICC line after IV ABX end date per Dr Fanny Bien. No further questions at this time.  Devota Pace, RN  10/06/2018, 09:25

## 2018-10-06 NOTE — Telephone Encounter (Signed)
-----   Message from Katha Cabal sent at 10/06/2018  8:46 AM EST -----  Ward Chatters, MD    [facility] at home calling to let you know that the IV antibiotics are due to be completed tomorrow 10/07/18 and they wanted to know if they should pull the PICC line.  Please call to advise.

## 2018-10-25 ENCOUNTER — Ambulatory Visit (HOSPITAL_BASED_OUTPATIENT_CLINIC_OR_DEPARTMENT_OTHER): Payer: BC Managed Care – PPO

## 2018-10-25 ENCOUNTER — Encounter (HOSPITAL_BASED_OUTPATIENT_CLINIC_OR_DEPARTMENT_OTHER): Payer: Self-pay | Admitting: Infectious Disease

## 2018-10-25 ENCOUNTER — Encounter (INDEPENDENT_AMBULATORY_CARE_PROVIDER_SITE_OTHER): Payer: Self-pay

## 2018-10-25 ENCOUNTER — Ambulatory Visit: Payer: BC Managed Care – PPO | Attending: Infectious Disease | Admitting: Infectious Disease

## 2018-10-25 VITALS — BP 136/80 | HR 81 | Temp 97.9°F | Ht 70.87 in | Wt 261.9 lb

## 2018-10-25 DIAGNOSIS — T8459XD Infection and inflammatory reaction due to other internal joint prosthesis, subsequent encounter: Secondary | ICD-10-CM

## 2018-10-25 DIAGNOSIS — L02415 Cutaneous abscess of right lower limb: Secondary | ICD-10-CM

## 2018-10-25 DIAGNOSIS — Z6836 Body mass index (BMI) 36.0-36.9, adult: Secondary | ICD-10-CM

## 2018-10-25 DIAGNOSIS — L03115 Cellulitis of right lower limb: Secondary | ICD-10-CM

## 2018-10-25 DIAGNOSIS — Z89441 Acquired absence of right ankle: Secondary | ICD-10-CM | POA: Insufficient documentation

## 2018-10-25 DIAGNOSIS — Z01818 Encounter for other preprocedural examination: Secondary | ICD-10-CM

## 2018-10-25 DIAGNOSIS — Z96669 Presence of unspecified artificial ankle joint: Secondary | ICD-10-CM

## 2018-10-25 LAB — CBC WITH DIFF
BASOPHIL #: <0.1 10*3/uL (ref <=0.20)
BASOPHIL %: 1 %
EOSINOPHIL #: 0.25 10*3/uL (ref <=0.50)
EOSINOPHIL %: 3 %
HCT: 38.9 % (ref 38.9–52.0)
HGB: 12.3 g/dL — ABNORMAL LOW (ref 13.4–17.5)
IMMATURE GRANULOCYTE #: <0.1 10*3/uL (ref <0.10)
IMMATURE GRANULOCYTE %: 1 % (ref 0–1)
LYMPHOCYTE #: 1.75 10*3/uL (ref 1.00–4.80)
LYMPHOCYTE %: 21 %
MCH: 27.2 pg (ref 26.0–32.0)
MCHC: 31.6 g/dL (ref 31.0–35.5)
MCV: 86.1 fL (ref 78.0–100.0)
MONOCYTE #: 0.56 10*3/uL (ref 0.20–1.10)
MONOCYTE %: 7 %
MPV: 10.7 fL (ref 8.7–12.5)
NEUTROPHIL #: 5.67 10*3/uL (ref 1.50–7.70)
NEUTROPHIL %: 67 %
PLATELETS: 306 10*3/uL (ref 150–400)
RBC: 4.52 10*6/uL (ref 4.50–6.10)
RDW-CV: 13.6 % (ref 11.5–15.5)
WBC: 8.4 10*3/uL (ref 3.7–11.0)

## 2018-10-25 LAB — SEDIMENTATION RATE: ERYTHROCYTE SEDIMENTATION RATE (ESR): 21 mm/h — ABNORMAL HIGH (ref 0–15)

## 2018-10-25 LAB — HEPATIC FUNCTION PANEL
ALBUMIN: 3.7 g/dL (ref 3.5–5.0)
ALKALINE PHOSPHATASE: 100 U/L (ref 45–115)
ALT (SGPT): 16 U/L (ref <55)
AST (SGOT): 15 U/L (ref 8–48)
BILIRUBIN DIRECT: 0.1 mg/dL (ref <0.3)
BILIRUBIN TOTAL: 0.2 mg/dL — ABNORMAL LOW (ref 0.3–1.3)
PROTEIN TOTAL: 7.8 g/dL (ref 6.4–8.3)

## 2018-10-25 LAB — C-REACTIVE PROTEIN(CRP),INFLAMMATION: CRP INFLAMMATION: 8.1 mg/L — ABNORMAL HIGH (ref <=8.0)

## 2018-10-25 NOTE — Progress Notes (Signed)
[facility]  [place], [place] [address]  O: [phone]  F: [phone]    [name] HISTORY & PHYSICAL VISIT    PATIENT NAME:  [name]  DATE OF BIRTH:  19-Aug-1966  MRN:  [id number]  DATE OF SERVICE:  10/25/2018    Indication(s) for Surgery:    1. S/p R TAA explantation with placement abx cement spacer and external fixator  Planned Procedure(s):    1. Removal external fixator and conversion to TTC fusion  Operative Side:  Right  Planned Admission Status:  23 hours    SUBJECTIVE:   [name] is a 53 y.o. male presenting to clinic today for his pre-operative history and physical for the above-named surgical procedure(s).  [name] was last evaluated in our clinic on 09/13/18 as a postoperative visit. He has completed IV antibiotics. His CRP has normalized. We will plan for conversion to fusion.    REVIEW OF SYSTEMS:  All systems reviewed and are negative, except as noted above.      PAST MEDICAL HISTORY:  Past Medical History:   Diagnosis Date    CPAP (continuous positive airway pressure) dependence     setting 13    Difficult intubation     Esophageal reflux     Essential hypertension     Hyperlipidemia     Plasma cell tumor (CMS HCC)     Rash     rle, told he had psorasis, seen derm, given cream    Sleep apnea      DAILY MEDICATIONS:  Current Outpatient Medications   Medication Sig    AMLODIPINE BES/OLMESARTAN MED (AZOR ORAL) Take by mouth    atorvastatin (LIPITOR) 10 mg Oral Tablet Take 10 mg by mouth Once a day    colchicine 0.6 mg Oral Tablet Take 0.6 mg by mouth Once a day    esomeprazole magnesium (NEXIUM) 20 mg Oral Capsule, Delayed Release(E.C.) Take 20 mg by mouth Every morning before breakfast    multivitamin Oral Tablet Take 1 Tab by mouth Once a day    spironolactone (ALDACTONE) 25 mg Oral Tablet Take 25 mg by mouth Every  morning with breakfast    temazepam (RESTORIL) 15 mg Oral Capsule Take 15 mg by mouth Every night as needed for Insomnia     KNOWN DRUG ALLERGIES:  No Known Allergies  PAST SURGICAL HISTORY:  Past Surgical History:   Procedure Laterality Date    Hx knee surgery      Hx other      Hx tonsillectomy       SOCIAL HISTORY:  Social History     Occupational History    Not on file   Tobacco Use    Smoking status: Never Smoker    Smokeless tobacco: Current User     Types: Snuff    Tobacco comment: has cut back on snuff use   Substance and Sexual Activity    Alcohol use: Yes     Alcohol/week: 12.0 standard drinks     Types: 12 Cans of beer per week    Drug use: No    Sexual activity: Not on file     FAMILY HISTORY:  Family History   Problem Relation Age of Onset    Hypertension (High Blood Pressure) Father      Medical, Surgical, Social, and Family Histories, as well as  Medications and Allergies, were reviewed with [name] and updated as appropriate.      OBJECTIVE:  There were no vitals taken for this visit.    Constitutional:  Alert and oriented.  No acute distress.   HEENT:  Normocephalic.  Atraumatic.    Eyes:  Extraocular movements intact.   Neck:  Free range of motion of the neck without pain.    Respiratory:  No respiratory distress.  Unlabored effort.    Abdominal:  No obvious distention.    Neurologic:  CNII-XII grossly intact.    Psychiatric:  Awake and alert.      Focused musculoskeletal exam of the right lower extremity:    Pulses:  1+ DP limited due to swelling    Sensation:  Sensation intact to light touch.    Skin:  Normal temperature, color, and tone.    Swelling:  Edema present   Tenderness:  Minimal tenderness present   Deformity:  No deformity.    Thompson's test:  Normal.    Gait:  Not assessed.      ASSESSMENT:  1. S/p explantation of R TAA for infection    PLAN:  After discussing the risks, benefits, and alternatives of surgical intervention, [name] again confirmed that he would like  to proceed.  Surgical consent will be signed on day of surgery. Preop H&P will be updated on day of surgery       Planned surgical procedure(s):    1. Removal external fixator RLE  2. TTC fusion with allograft right ankle   Operative side:     Right   Planned date of surgery:     Nov 28, 2018   Planned follow-up:     Planned weight bearing status:     NWB RLE   Pre-require met:     Yes   Nicotine, A1c, Albumin, and anticoagulant check completed, if applicable:   No new labs   Screen tool abnormalities:     None   Consent signed and witnessed:     No   Post-operative instructions reviewed:     No   Performed NARxCHECK:     Yes   Documented "First Opioid Prescription" in separate progress note:   No   [facility] Policy P950 "Consent to Treat with Opioid Medications" reviewed, signed, and witnessed:     No   Patient sent to PAT:     No      [name] was encouraged to utilize [facility] as an additional means of communication with our clinic, as this will allow [place] to provide him with more expedient care.      All of his questions were answered, and he appeared satisfied with the plan of treatment.  Should he have any further questions or concerns, he was advised to contact our clinic.      Leonard A. Rhina Brackett, MD  Resident, PGY-5  Department of Orthopaedics  PAGER = [phone]  10/25/2018 11:16    I saw and examined the patient.  I directly supervised the resident's activities and procedures.  I reviewed the resident's note.  I agree with the findings and plan of care as documented in the resident's note.    Leonard Baltimore Kurtis Bushman, MD  Associate Professor  Chief of Foot & Ankle Surgery  Department of [facility]  10/25/2018 11:34 AM

## 2018-10-25 NOTE — Progress Notes (Signed)
Orthopedic Infectious Diseases Follow-up    Reason for visit: follow up right ankle prosthetic joint infection    HPI:   [name] is a pleasant 53 y.o. 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 Cultures from that surgery had only 1 of 3 with streptococcus, however he received multiple courses of antibiotics in preceding weeks. Thus, he was placed on IV vancomycin and IV cefepime for planned 6 weeks from 08/26/18. He has now completed all 6 weeks of broad spectrum coverage with end date on 10/06/18. He reports no issues since stopping antibiotic therapy and that per home health nurses the incisions are doing great with no erythema, no drainage, no concerns. 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     His history regarding the ankle was initial surgery in 2016 but always had some pain in that ankle following initial surgery. 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 He then presented to [facility] on 08/13/18 with acute pain/redness of right ankle. He was discharged from ER and then went to [facility] where he was given PO clindamycin (?for cellulitis). He returned after 3 days with ongoing symptoms where he was admitted and started on IV vancomycin and Ceftriaxone until discharge on 08/21/18. He was referred to [place] where he was seen on 08/23/18 and admitted for right ankle washout and hardware removal. So he had antibiotics up until admission here. Blood cultures at outside hospital were negative. His OR cultures were then likely affected by the preceding antibiotics, so inpatient consult team did not narrow  therapy to target strep dysgalactiae alone.        Past Medical History:     Past Medical History:   Diagnosis Date   . CPAP (continuous positive airway pressure) dependence     setting 13   . Difficult intubation    . Esophageal reflux    . Essential hypertension    . Hyperlipidemia    . Plasma cell tumor (CMS HCC)    . Rash     rle, told he had psorasis, seen derm, given cream   . Sleep apnea            Past Surgical History:     Past Surgical History:   Procedure Laterality Date   . HX KNEE SURGERY      right   . HX OTHER      right ankle x3   . HX TONSILLECTOMY            Medications:     Current Outpatient Medications   Medication Sig   . AMLODIPINE BES/OLMESARTAN MED (AZOR ORAL) Take by mouth   . atorvastatin (LIPITOR) 10 mg Oral Tablet Take 10 mg by mouth Once a day   . colchicine 0.6 mg Oral Tablet Take 0.6 mg by mouth Once a day   . esomeprazole magnesium (NEXIUM) 20 mg Oral Capsule, Delayed Release(E.C.) Take 20 mg by mouth Every morning before breakfast   . multivitamin Oral Tablet Take 1 Tab by mouth Once a day   . oxyCODONE-acetaminophen (PERCOCET) 5-325 mg Oral Tablet Take 1-2 Tabs by mouth Every 4  hours as needed for Pain (Patient not taking: Reported on 10/25/2018)   . sennosides-docusate sodium (SENOKOT-S) 8.6-50 mg Oral Tablet Take 1 Tab by mouth Every evening (Patient not taking: Reported on 10/25/2018)   . spironolactone (ALDACTONE) 25 mg Oral Tablet Take 25 mg by mouth Every morning with breakfast   . temazepam (RESTORIL) 15 mg Oral Capsule Take 15 mg by mouth Every night as needed for Insomnia       Allergies:     Allergy History as of 10/25/18      No Known Allergies               Family History:  HTN in family    Social History:   From [place]. Denies smoking, but reports etoh about 12 beers/week. No drug use. No travel outside the country, but along neighboring states frequently. No animal exposures. 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      Review of Systems:   All  other review of systems negative, except as documented in HPI.    Objective:   Vitals:  Blood pressure 136/80, pulse 81, temperature 36.6 C (97.9 F), temperature source Thermal Scan, height 1.8 m (5' 10.87"), weight 118.8 kg (261 lb 14.5 oz).   Body mass index is 36.67 kg/m.  General:  Pleasant, well-appearing, no acute distress  Skin:  Warm, dry, no rashes  HEENT:  EOMI, no scleral icters, mmm  Chest: non-labored breathing, no respiratory distress  Abdomen: soft, non-tender, non-distended, normoactive bowel sounds  Musculoskeletal: right ankle with external fixator in place  Neurological: awake, alert, and oriented x3, no focal deficits      Labs:   I have reviewed the labs from most recent hospitalization. In particular:  10/03/18: CRP 5, WBC 7  10/06/18: BUN 16/Cr 0.7      Assessment & Plan:   53yo male here for follow up after undergoing right ankle hardware explant and antibiotic spacer placement on 08/26/18 for right ankle PJI. Cultures only had rare strep dysgalactiae in 1 of 3 specimens but this was after receiving multiple antibiotic courses from 10/26-11/5 at outside facilities. He has now completed 6 weeks of IV vancomycin and IV cefepime with normalization of his CRP at that time. He has been doing well off antibiotics for almost 3 weeks with signs of infection and reportedly no issues with wound/incisions. He is seeing Dr. Irena Cords today for follow up and scheduling of his next surgery (which Mr. Kimberlin hopes is soon).    - - checking labs: CBC/diff, CRP, ESR, BUN/Cr, Hepatic Function (off abx for almost 3 weeks)  - - cleared from ID standpoint to proceed with next surgery  - - no need for ID follow up at this time but happy to see in future if new issues arise      Ward Chatters, MD  Assistant Professor, [facility] Medicine

## 2018-11-22 ENCOUNTER — Inpatient Hospital Stay (HOSPITAL_COMMUNITY)
Admission: RE | Admit: 2018-11-22 | Discharge: 2018-11-22 | Disposition: A | Discharge status: Home / Self Care | Payer: BC Managed Care – PPO | Source: Ambulatory Visit

## 2018-11-22 ENCOUNTER — Other Ambulatory Visit: Payer: Self-pay

## 2018-11-22 ENCOUNTER — Encounter (HOSPITAL_COMMUNITY): Payer: Self-pay

## 2018-11-27 MED ORDER — CEFAZOLIN 2 GRAM SOLUTION FOR INJECTION - IV PUSH KIT
2.0000 g | Freq: Once | INTRAMUSCULAR | Status: AC
Start: 2018-11-27 — End: 2018-11-28
  Administered 2018-11-28: 2 g via INTRAVENOUS
  Filled 2018-11-27: qty 20

## 2018-11-27 MED ORDER — CEFAZOLIN 2 GRAM SOLUTION FOR INJECTION - IV PUSH KIT
2.0000 g | Freq: Once | INTRAMUSCULAR | Status: DC
Start: 2018-11-27 — End: 2018-11-28
  Filled 2018-11-27: qty 20

## 2018-11-28 ENCOUNTER — Encounter (HOSPITAL_COMMUNITY): Payer: Self-pay

## 2018-11-28 ENCOUNTER — Encounter (HOSPITAL_COMMUNITY): Admission: RE | Disposition: A | Discharge status: Home / Self Care | Payer: Self-pay | Source: Ambulatory Visit

## 2018-11-28 ENCOUNTER — Ambulatory Visit (HOSPITAL_COMMUNITY): Payer: BC Managed Care – PPO | Admitting: Registered Nurse

## 2018-11-28 ENCOUNTER — Ambulatory Visit (HOSPITAL_COMMUNITY): Payer: BC Managed Care – PPO

## 2018-11-28 ENCOUNTER — Encounter (HOSPITAL_COMMUNITY): Payer: BC Managed Care – PPO | Admitting: Student in an Organized Health Care Education/Training Program

## 2018-11-28 ENCOUNTER — Observation Stay
Admission: RE | Admit: 2018-11-28 | Discharge: 2018-11-29 | Disposition: A | Discharge status: Home / Self Care | Payer: BC Managed Care – PPO | Source: Ambulatory Visit

## 2018-11-28 ENCOUNTER — Inpatient Hospital Stay (HOSPITAL_COMMUNITY)
Admission: RE | Admit: 2018-11-28 | Discharge: 2018-11-28 | Disposition: A | Discharge status: Home / Self Care | Payer: BC Managed Care – PPO | Source: Ambulatory Visit

## 2018-11-28 ENCOUNTER — Observation Stay (HOSPITAL_BASED_OUTPATIENT_CLINIC_OR_DEPARTMENT_OTHER): Payer: BC Managed Care – PPO

## 2018-11-28 ENCOUNTER — Other Ambulatory Visit: Payer: Self-pay

## 2018-11-28 ENCOUNTER — Encounter (HOSPITAL_COMMUNITY): Payer: Self-pay | Admitting: Student in an Organized Health Care Education/Training Program

## 2018-11-28 DIAGNOSIS — Z720 Tobacco use: Secondary | ICD-10-CM | POA: Insufficient documentation

## 2018-11-28 DIAGNOSIS — E785 Hyperlipidemia, unspecified: Secondary | ICD-10-CM | POA: Insufficient documentation

## 2018-11-28 DIAGNOSIS — T8459XA Infection and inflammatory reaction due to other internal joint prosthesis, initial encounter: Principal | ICD-10-CM | POA: Insufficient documentation

## 2018-11-28 DIAGNOSIS — G8918 Other acute postprocedural pain: Secondary | ICD-10-CM

## 2018-11-28 DIAGNOSIS — K219 Gastro-esophageal reflux disease without esophagitis: Secondary | ICD-10-CM | POA: Insufficient documentation

## 2018-11-28 DIAGNOSIS — Z4789 Encounter for other orthopedic aftercare: Secondary | ICD-10-CM

## 2018-11-28 DIAGNOSIS — M25571 Pain in right ankle and joints of right foot: Secondary | ICD-10-CM

## 2018-11-28 DIAGNOSIS — Z9889 Other specified postprocedural states: Secondary | ICD-10-CM | POA: Diagnosis present

## 2018-11-28 DIAGNOSIS — I10 Essential (primary) hypertension: Secondary | ICD-10-CM | POA: Insufficient documentation

## 2018-11-28 HISTORY — DX: Malignant (primary) neoplasm, unspecified (CMS HCC): C80.1

## 2018-11-28 HISTORY — DX: Arthropathy, unspecified: M12.9

## 2018-11-28 HISTORY — DX: Nausea with vomiting, unspecified: R11.2

## 2018-11-28 HISTORY — DX: Other specified postprocedural states: Z98.890

## 2018-11-28 LAB — CBC WITH DIFF
BASOPHIL #: <0.1 x10ˆ3/uL (ref <=0.20)
BASOPHIL %: 0 %
EOSINOPHIL #: <0.1 x10ˆ3/uL (ref <=0.50)
EOSINOPHIL %: 0 %
HCT: 32.7 % — ABNORMAL LOW (ref 38.9–52.0)
HGB: 10.8 g/dL — ABNORMAL LOW (ref 13.4–17.5)
IMMATURE GRANULOCYTE #: 0.1 x10ˆ3/uL — ABNORMAL HIGH (ref <0.10)
IMMATURE GRANULOCYTE %: 1 % (ref 0–1)
LYMPHOCYTE #: 1.47 x10ˆ3/uL (ref 1.00–4.80)
LYMPHOCYTE %: 10 %
MCH: 27.1 pg (ref 26.0–32.0)
MCHC: 33 g/dL (ref 31.0–35.5)
MCV: 82.2 fL (ref 78.0–100.0)
MONOCYTE #: 0.81 x10ˆ3/uL (ref 0.20–1.10)
MONOCYTE %: 5 %
MPV: 10.1 fL (ref 8.7–12.5)
NEUTROPHIL #: 12.79 x10ˆ3/uL — ABNORMAL HIGH (ref 1.50–7.70)
NEUTROPHIL %: 84 %
PLATELETS: 229 x10ˆ3/uL (ref 150–400)
RBC: 3.98 x10ˆ6/uL — ABNORMAL LOW (ref 4.50–6.10)
RDW-CV: 14.3 % (ref 11.5–15.5)
WBC: 15.2 x10ˆ3/uL — ABNORMAL HIGH (ref 3.7–11.0)

## 2018-11-28 LAB — BASIC METABOLIC PANEL
ANION GAP: 6 mmol/L (ref 4–13)
BUN/CREA RATIO: 19 (ref 6–22)
BUN: 17 mg/dL (ref 8–25)
CALCIUM: 9.2 mg/dL (ref 8.5–10.2)
CHLORIDE: 107 mmol/L (ref 96–111)
CO2 TOTAL: 26 mmol/L (ref 22–32)
CREATININE: 0.9 mg/dL (ref 0.62–1.27)
ESTIMATED GFR: >60 mL/min/{1.73_m2} (ref >60)
GLUCOSE: 103 mg/dL (ref 65–139)
POTASSIUM: 3.8 mmol/L (ref 3.5–5.1)
SODIUM: 139 mmol/L (ref 136–145)

## 2018-11-28 SURGERY — ARTHRODESIS FOOT
Anesthesia: General | Laterality: Right | Wound class: Clean Wound: Uninfected operative wounds in which no inflammation occurred

## 2018-11-28 MED ORDER — ONDANSETRON HCL (PF) 4 MG/2 ML INJECTION SOLUTION
Freq: Once | INTRAMUSCULAR | Status: DC | PRN
Start: 2018-11-28 — End: 2018-11-28
  Administered 2018-11-28: 4 mg via INTRAVENOUS

## 2018-11-28 MED ORDER — SODIUM CHLORIDE 0.9 % (FLUSH) INJECTION SYRINGE
2.00 mL | INJECTION | INTRAMUSCULAR | Status: DC | PRN
Start: 2018-11-28 — End: 2018-11-29

## 2018-11-28 MED ORDER — CELECOXIB 200 MG CAPSULE: 200 mg | Cap | Freq: Two times a day (BID) | ORAL | 0 refills | 0 days | Status: AC

## 2018-11-28 MED ORDER — OXYCODONE-ACETAMINOPHEN 5 MG-325 MG TABLET
2.0000 | ORAL_TABLET | ORAL | Status: DC | PRN
Start: 2018-11-28 — End: 2018-11-29
  Filled 2018-11-28: qty 2

## 2018-11-28 MED ORDER — SODIUM CHLORIDE 0.9 % INTRAVENOUS SOLUTION
INTRAVENOUS | Status: DC
Start: 2018-11-28 — End: 2018-11-29
  Administered 2018-11-29: 0 via INTRAVENOUS

## 2018-11-28 MED ORDER — ACETAMINOPHEN 1,000 MG/100 ML (10 MG/ML) INTRAVENOUS SOLUTION
Freq: Once | INTRAVENOUS | Status: DC | PRN
Start: 2018-11-28 — End: 2018-11-28
  Administered 2018-11-28: 1000 mg via INTRAVENOUS

## 2018-11-28 MED ORDER — FENTANYL (PF) 50 MCG/ML INJECTION SOLUTION
12.5000 ug | INTRAMUSCULAR | Status: DC | PRN
Start: 2018-11-28 — End: 2018-11-28

## 2018-11-28 MED ORDER — FENTANYL (PF) 50 MCG/ML INJECTION SOLUTION
100.0000 ug | Freq: Once | INTRAMUSCULAR | Status: DC | PRN
Start: 2018-11-28 — End: 2018-11-28
  Administered 2018-11-28: 100 ug via INTRAVENOUS
  Filled 2018-11-28: qty 2

## 2018-11-28 MED ORDER — MORPHINE 4 MG/ML INTRAVENOUS SOLUTION
4.0000 mg | INTRAVENOUS | Status: DC | PRN
Start: 2018-11-28 — End: 2018-11-29

## 2018-11-28 MED ORDER — FENTANYL (PF) 50 MCG/ML INJECTION SOLUTION
25.0000 ug | INTRAMUSCULAR | Status: DC | PRN
Start: 2018-11-28 — End: 2018-11-28

## 2018-11-28 MED ORDER — ROPIVACAINE (PF) 5 MG/ML (0.5 %) INJECTION SOLUTION
30.0000 mL | Freq: Once | INTRAMUSCULAR | Status: DC | PRN
Start: 2018-11-28 — End: 2018-11-28
  Administered 2018-11-28: 40 mL via INTRAMUSCULAR

## 2018-11-28 MED ORDER — SENNOSIDES 8.6 MG-DOCUSATE SODIUM 50 MG TABLET: 1 | Tab | Freq: Every evening | ORAL | 0 refills | 0 days | Status: AC

## 2018-11-28 MED ORDER — SODIUM CHLORIDE 0.9 % (FLUSH) INJECTION SYRINGE
2.00 mL | INJECTION | Freq: Three times a day (TID) | INTRAMUSCULAR | Status: DC
Start: 2018-11-28 — End: 2018-11-29
  Administered 2018-11-28 – 2018-11-29 (×2): 0 mL

## 2018-11-28 MED ORDER — LACTATED RINGERS INTRAVENOUS SOLUTION
INTRAVENOUS | Status: DC
Start: 2018-11-28 — End: 2018-11-28

## 2018-11-28 MED ORDER — ASPIRIN 325 MG TABLET
325.0000 mg | ORAL_TABLET | Freq: Every day | ORAL | 0 refills | Status: AC
Start: 2018-11-28 — End: 2018-12-19

## 2018-11-28 MED ORDER — SODIUM CHLORIDE 0.9 % (FLUSH) INJECTION SYRINGE
2.0000 mL | INJECTION | INTRAMUSCULAR | Status: DC | PRN
Start: 2018-11-28 — End: 2018-11-29

## 2018-11-28 MED ORDER — ATORVASTATIN 10 MG TABLET
10.0000 mg | ORAL_TABLET | Freq: Every day | ORAL | Status: DC
Start: 2018-11-28 — End: 2018-11-29
  Administered 2018-11-28: 10 mg via ORAL
  Administered 2018-11-29: 0 mg via ORAL
  Filled 2018-11-28: qty 1

## 2018-11-28 MED ORDER — SODIUM CHLORIDE 0.9 % (FLUSH) INJECTION SYRINGE
20.0000 mL | INJECTION | Freq: Once | INTRAMUSCULAR | Status: DC | PRN
Start: 2018-11-28 — End: 2018-11-28

## 2018-11-28 MED ORDER — DEXTROSE 5 % IN WATER (D5W) INTRAVENOUS SOLUTION
3.0000 g | Freq: Three times a day (TID) | INTRAVENOUS | Status: DC
Start: 2018-11-28 — End: 2018-11-29
  Administered 2018-11-28 (×2): 3 g via INTRAVENOUS
  Administered 2018-11-28 – 2018-11-29 (×2): 0 g via INTRAVENOUS
  Administered 2018-11-29: 3 g via INTRAVENOUS
  Administered 2018-11-29: 0 g via INTRAVENOUS
  Filled 2018-11-28 (×4): qty 30

## 2018-11-28 MED ORDER — GLYCOPYRROLATE 0.2 MG/ML INJECTION SOLUTION
Freq: Once | INTRAMUSCULAR | Status: DC | PRN
Start: 2018-11-28 — End: 2018-11-28
  Administered 2018-11-28: .1 mg via INTRAVENOUS

## 2018-11-28 MED ORDER — SPIRONOLACTONE 25 MG TABLET
25.00 mg | ORAL_TABLET | Freq: Every morning | ORAL | Status: DC
Start: 2018-11-29 — End: 2018-11-29
  Administered 2018-11-29: 0 mg via ORAL

## 2018-11-28 MED ORDER — LIDOCAINE (PF) 100 MG/5 ML (2 %) INTRAVENOUS SYRINGE
INJECTION | Freq: Once | INTRAVENOUS | Status: DC | PRN
Start: 2018-11-28 — End: 2018-11-28
  Administered 2018-11-28: 100 mg via INTRAVENOUS

## 2018-11-28 MED ORDER — OXYCODONE-ACETAMINOPHEN 5 MG-325 MG TABLET
1.0000 | ORAL_TABLET | ORAL | 0 refills | Status: DC | PRN
Start: 2018-11-28 — End: 2019-03-21

## 2018-11-28 MED ORDER — DEXMEDETOMIDINE 4 MCG/ML IV DILUTION
Freq: Once | INTRAMUSCULAR | Status: DC | PRN
Start: 2018-11-28 — End: 2018-11-28
  Administered 2018-11-28 (×4): 8 ug via INTRAVENOUS

## 2018-11-28 MED ORDER — DEXAMETHASONE SODIUM PHOSPHATE 4 MG/ML INJECTION SOLUTION
Freq: Once | INTRAMUSCULAR | Status: DC | PRN
Start: 2018-11-28 — End: 2018-11-28
  Administered 2018-11-28: 4 mg via INTRAVENOUS

## 2018-11-28 MED ORDER — OXYCODONE-ACETAMINOPHEN 5 MG-325 MG TABLET
1.0000 | ORAL_TABLET | ORAL | Status: DC | PRN
Start: 2018-11-28 — End: 2018-11-29
  Administered 2018-11-28 – 2018-11-29 (×5): 1 via ORAL
  Filled 2018-11-28 (×3): qty 1

## 2018-11-28 MED ORDER — MIDAZOLAM 1 MG/ML INJECTION SOLUTION
2.0000 mg | Freq: Once | INTRAMUSCULAR | Status: DC | PRN
Start: 2018-11-28 — End: 2018-11-28
  Administered 2018-11-28: 2 mg via INTRAVENOUS
  Filled 2018-11-28: qty 2

## 2018-11-28 MED ORDER — SENNOSIDES 8.6 MG-DOCUSATE SODIUM 50 MG TABLET
2.0000 | ORAL_TABLET | Freq: Two times a day (BID) | ORAL | Status: DC
Start: 2018-11-28 — End: 2018-11-29
  Administered 2018-11-28 (×2): 2 via ORAL
  Filled 2018-11-28 (×2): qty 2

## 2018-11-28 MED ORDER — PROPOFOL 10 MG/ML IV BOLUS
INJECTION | Freq: Once | INTRAVENOUS | Status: DC | PRN
Start: 2018-11-28 — End: 2018-11-28
  Administered 2018-11-28: 200 mg via INTRAVENOUS

## 2018-11-28 MED ORDER — LACTATED RINGERS INTRAVENOUS SOLUTION
INTRAVENOUS | Status: DC
Start: 2018-11-28 — End: 2018-11-29

## 2018-11-28 MED ORDER — ONDANSETRON HCL (PF) 4 MG/2 ML INJECTION SOLUTION
4.0000 mg | Freq: Four times a day (QID) | INTRAMUSCULAR | Status: DC | PRN
Start: 2018-11-28 — End: 2018-11-29

## 2018-11-28 MED ORDER — SODIUM CHLORIDE 0.9 % (FLUSH) INJECTION SYRINGE
2.0000 mL | INJECTION | Freq: Three times a day (TID) | INTRAMUSCULAR | Status: DC
Start: 2018-11-28 — End: 2018-11-29
  Administered 2018-11-28 – 2018-11-29 (×4): 0 mL

## 2018-11-28 MED ORDER — LORAZEPAM 1 MG TABLET
1.00 mg | ORAL_TABLET | Freq: Every evening | ORAL | Status: DC | PRN
Start: 2018-11-28 — End: 2018-11-29

## 2018-11-28 MED ADMIN — dorzolamide 2 % eye drops: INTRAVENOUS | @ 14:00:00

## 2018-11-28 MED ADMIN — potassium chloride 20 mEq/L in dextrose 5 %-0.45 % sodium chloride IV: @ 22:00:00 | NDC 00338067104

## 2018-11-28 MED ADMIN — oxyCODONE-acetaminophen 5 mg-325 mg tablet: ORAL | @ 12:00:00

## 2018-11-28 MED ADMIN — sodium chloride 0.9 % (flush) injection syringe: @ 06:00:00

## 2018-11-28 MED ADMIN — lactated Ringers intravenous solution: INTRAVENOUS | @ 06:00:00

## 2018-11-28 MED ADMIN — lidocaine 5 % topical patch: @ 22:00:00

## 2018-11-28 MED ADMIN — sodium chloride 0.9 % intravenous solution: ORAL | @ 12:00:00 | NDC 00338004904

## 2018-11-28 SURGICAL SUPPLY — 47 items
BAG 28X36IN BAND EQP (DRAPE/PACKS/SHEETS/OR TOWEL) ×1 IMPLANT
BLADE SAW 11MM 40MM LAPIPLASTY (CUTTING ELEMENTS)
BLADE SAW 11MM 40MM LAPIPLASTY (SURGICAL CUTTING SUPPLIES) IMPLANT
BLADE SAW 25X9MM OSCILLATE SGTL AGRS OFST SS THK.38MM MED THN PREC STRL LF  DISP (SURGICAL CUTTING SUPPLIES) IMPLANT
BLADE SAW 31X9MM OSCILLATE SGT_L SS LONG MED PREC STRL (CUTTING ELEMENTS)
BLADE SAW 90X13X.89MM SGTL BVL THK.035IN STRYK PRFRM SER SYS 6 STRL LF  DISP (SURGICAL CUTTING SUPPLIES) ×1 IMPLANT
BLADE SAW 90X13X.89MM SGTL BVL_THK.035IN STRYK PRFRM SER SYS (CUTTING ELEMENTS) ×1
BLADE SAW 9MM OSCILLATE SGTL A_GRS THN SS THK.38MM 25MM MED (CUTTING ELEMENTS)
BLADE SAW 9MM OSCILLATE SGTL SS THK.51MM 31MM MED LONG PREC STRL LF  DISP (SURGICAL CUTTING SUPPLIES) IMPLANT
BLANKET MISTRAL-AIR ADULT UPR BODY 79X29.9IN FRC AIR HI VOL BLWR INTUITIVE CONTROL PNL LRG LED (MISCELLANEOUS PT CARE ITEMS) ×1 IMPLANT
BLANKET MISTRAL-AIR UPR BODY 7_8.7X29.9IN FRC AIR WARM (MISCELLANEOUS PT CARE ITEMS) ×1
CAP END FOOT ANKL NAIL VALOR ×1 IMPLANT
CLARIX 6X3CM THK1MM AMNIO MEMBR UMB CORD ALLOGRAFT MATRIX TISS (TISSUE/PREPARE) ×1 IMPLANT
CONV USE ITEM 322843 - MAT OR SEAFOAM GRN 40X28IN SURGISAFE BRR BCK SLIP FREE (FLOR) IMPLANT
CONV USE ITEM 337890 - PACK SURG BSIN 2 STRL LF  DISP (CUSTOM TRAYS & PACK) ×1 IMPLANT
COVER WAND RFD STRL 50EA/CS_01-0020 (EQUIPMENT MINOR) ×1
COVER WND RF DETECT STRL CLR EQP (EQUIPMENT MINOR) ×1 IMPLANT
DISC USE 317039 - SUB BONEGRAFT PRO-STIM DBM CA_SLF CA PHOS INJ OSTINDC 4ML (TISSUE/PREPARE) ×1 IMPLANT
DRAPE BND BAG 28X36IN SNAP KOV_ER EQP (DRAPE/PACKS/SHEETS/OR TOWEL) ×1
DRAPE FLRSCN CARM STRAP 85X54IN MINI KOVER LF  STRL EQP POLY (DRAPE/PACKS/SHEETS/OR TOWEL) IMPLANT
DRAPE FLRSCN CARM STRAP 85X54I_N MINI KOVER LF STRL EQP POLY (DRAPE/PACKS/SHEETS/OR TOWEL)
DRESS WOUND 4X4IN JUMPSTART ANTIMIC ORDER IN MULTIPLES OF 10 EACH (WOUND CARE SUPPLY) ×1 IMPLANT
DRESSING JUMPSTART 4X4 (WOUND CARE/ENTEROSTOMAL SUPPLY) ×1
DRILL SURG SHORT HINDFOOT VALOR 4.3MM SCREW IMPLANT KIT (SURGICAL CUTTING SUPPLIES) ×1 IMPLANT
DRILL SURG SHORT HINDFOOT VALO_R 4.3MM SCREW IMPLANT KIT (CUTTING ELEMENTS) ×1
GARMENT COMPRESS MED CALF CENTAURA NYL VASOGRAD LTWT BRTHBL SEQ FIL BLU 18- IN (ORTHOPEDICS (NOT IMPLANTS)) ×1 IMPLANT
GARMENT COMPRESS MED CALF CENT_AURA NYL VASOGRAD LTWT BRTHBL (ORTHOPEDICS (NOT IMPLANTS)) ×1
GRAFT BONE MTRGRFT FEM HEAD ALLOGRAFT 43+ MM (TISSUE/PREPARE) ×1 IMPLANT
GW ORTHO 3MM VALOR BEAD TIP HINDFOOT FUS  SYS INTRAMED NAIL ANKL (ORTHOPEDICS (NOT IMPLANTS)) ×1 IMPLANT
GW ORTHO 3MM VALOR BEAD TIP HI_DFOOT FUS SYS INTRAMED NAIL (ORTHOPEDICS (NOT IMPLANTS)) ×1
GW ORTHO 3MM VALOR ENTRY HINDFOOT FUS  SYS INTRAMED NAIL ANKL (ORTHOPEDICS (NOT IMPLANTS)) ×1 IMPLANT
GW ORTHO 3MM VALOR ENTRY HINDF_OT FUS SYS INTRAMED NAIL (ORTHOPEDICS (NOT IMPLANTS)) ×1
KIT BONEGRAFT 3CC AUG INJ ×1 IMPLANT
MAT OR SEAFOAM GRN 40X28IN SURGISAFE BRR BCK SLIP FREE (FLOR)
NAIL 11.5MM FUS  HINDFOOT RGT 250MM VALOR INTRAMED LRG SILVER ×1 IMPLANT
PACK BASIN DBL CUSTOM (CUSTOM TRAYS & PACK) ×1
PACK FOOT AND ANKLE CUSTOM_1/CS (CUSTOM TRAYS & PACK) ×1
PACK SURG CSTM FOOT ANKL NONST DISP LF (CUSTOM TRAYS & PACK) ×1
PACK SURG CUSTOM FOOT ANKL NONST DISP LF (CUSTOM TRAYS & PACK) ×1 IMPLANT
SCREW BONE VALOR 5MM 35MM TI HINDFOOT FUS  SYS ×1 IMPLANT
SCREW BONE VALOR 5MM 55MM TI HINDFOOT FUS  SYS ×1 IMPLANT
SCREW BONE VALOR 5MM 80MM TI HINDFOOT FUS  SYS ×1 IMPLANT
SPLINT CAST 30X5IN SMOOTH FNSH_MOIST RST LOW EXOTHERM SPCLST (CAST) ×2 IMPLANT
SPONGE GAUZE STRL 4 X 4IN TUB_6939 1280/CS (WOUND CARE SUPPLY) ×1 IMPLANT
SPONGE GAUZE STRL 4 X 4IN TUB_6939 1280/CS (WOUND CARE/ENTEROSTOMAL SUPPLY) ×1
STIM BONE EXTERN BONE GRW NINVS (ORTHOPEDICS (NOT IMPLANTS)) ×1 IMPLANT
STIM BONE EXTERN BONE GRW NINV_S (ORTHOPEDICS (NOT IMPLANTS)) ×1

## 2018-11-28 NOTE — H&P (Signed)
[facility]  H&P Update Form    Ashleigh, Arya, 53 y.o. male  Encounter Start Date:  11/28/2018  Inpatient Admission Date:   Date of Birth:  January 05, 1966    11/28/2018    STOP: IF H&P IS GREATER THAN 30 DAYS FROM SURGICAL DAY COMPLETE NEW H&P IS REQUIRED.     H & P updated the day of the procedure.  1.  H&P completed within 30 days of surgical procedure by Dr. Si Gaul on 10/25/2018  and has been reviewed within 24 hours of the surgery, the patient has been examined, and no change has occured in the patients condition since the H&P was completed.       Change in medications: No                Comments:     2.  Patient continues to be appropiate candidate for planned surgical procedure. YES      Evans Lance, MD        I saw and examined the patient.  I directly supervised the resident's activities and procedures.  I reviewed the resident's note.  I agree with the findings and plan of care as documented in the resident's note.    Herbie Baltimore Kurtis Bushman, MD  Associate Professor  Chief of Foot & Ankle Surgery  Department of [place]  11/28/2018 1:33 PM

## 2018-11-28 NOTE — Anesthesia Transfer of Care (Signed)
ANESTHESIA TRANSFER OF CARE   [name] is a 53 y.o. ,male, Weight: 122.4 kg (269 lb 13.5 oz)   had Procedure(s):  ARTHRODESIS FOOT  performed  11/28/18   Primary Service: Hendricks Limes*    Past Medical History:   Diagnosis Date   . Cancer (CMS [place])    . CPAP (continuous positive airway pressure) dependence     setting 13   . Difficult intubation    . Esophageal reflux    . Essential hypertension    . Hyperlipidemia    . Plasma cell tumor (CMS HCC)    . PONV (postoperative nausea and vomiting)    . Rash     rle, told he had psorasis, seen derm, given cream   . Sleep apnea       Allergy History as of 11/28/18      No Known Allergies              I completed my transfer of care / handoff to the receiving personnel during which we discussed:  Access, Airway, All key/critical aspects of case discussed, Analgesia, Antibiotics, Expectation of post procedure, Fluids/Product, Gave opportunity for questions and acknowledgement of understanding, Labs and PMHx    Post Location: PACU                                          Additional Info:Pt transported to PACU with supplementary oxygen via simple face mask. Report given to RN; all questions answered. VSS, airway patent.                        Last OR Temp: Temperature: 36.4 C (97.5 F)  ABG:  POTASSIUM   Date Value Ref Range Status   11/28/2018 3.8 3.5 - 5.1 mmol/L Final   09/16/2018 4.9  Final     CALCIUM   Date Value Ref Range Status   11/28/2018 9.2 8.5 - 10.2 mg/dL Final     Calculated P Axis   Date Value Ref Range Status   08/23/2018 40 degrees Final     Calculated R Axis   Date Value Ref Range Status   08/23/2018 -20 degrees Final     Calculated T Axis   Date Value Ref Range Status   08/23/2018 10 degrees Final     GLUCOSE, POINT OF CARE   Date Value Ref Range Status   10/24/2014 97 70 - 105 mg/dL Final     Comment:     Cleaned MeterNondiabeticFasting     Airway:* No LDAs found *  Blood pressure 103/64, pulse 65, temperature 36.4 C (97.5 F), resp. rate  14, height 1.829 m (6'), weight 122.4 kg (269 lb 13.5 oz), SpO2 96 %.

## 2018-11-28 NOTE — Anesthesia Postprocedure Evaluation (Signed)
Anesthesia Post Op Evaluation    Patient: [name]  Procedure(s):  ARTHRODESIS FOOT    Last Vitals:Temperature: 36.5 C (97.7 F) (11/28/18 1028)  Heart Rate: 62 (11/28/18 1028)  BP (Non-Invasive): 108/65 (11/28/18 1028)  Respiratory Rate: 16 (11/28/18 1028)  SpO2: 93 % (11/28/18 1028)      Patient location during evaluation: PACU       Patient participation: complete - patient participated  Level of consciousness: awake  Pain management: adequate  Airway patency: patent  Anesthetic complications: no  Cardiovascular status: acceptable  Respiratory status: acceptable  Hydration status: acceptable  Patient post-procedure temperature: Pt Normothermic

## 2018-11-28 NOTE — Nurses Notes (Addendum)
Patient arrived to BOS 6N room 7 from OR. Pt A&Ox4, pain less than 2 at this time. Denies any needs or concern at this time. Family at bedside. Start regular diet. Sitter select pad in place. Admission question completed. Will monitor.

## 2018-11-28 NOTE — Consults (Signed)
[facility]  Acute Perioperative Pain Service Consult         Date of Service:  11/28/2018  [name], [name], 53 y.o. male  Encounter Start Date:  11/28/2018  Inpatient Admission Date:    Date of Birth:  1965/11/22    Type of Pain Consultation: Perioperative Pain  Consult Requested By: ortho  Reason for Consult: Acute Postoperative Pain (ICD-10-G89.18)  Laterality right, Type of Surgery, ankle fusion     Chief Complaint/Pain location: right ankle    History of Present Illness: [name] is a 53 y.o., White male  with chief complaint as above. Anesthesiology Perioperative Pain service consulted for evaluation of right ankle pain. The pain is anticipated to be described as sharp post surgical pain. Pain will be located at the surgical site. Onset is anticipated to start post-operatively. Aggravating factors: movement.  Alleviating factors: pain medications.       Past Medical History:  Past Medical History:   Diagnosis Date   . Cancer (CMS [facility])    . CPAP (continuous positive airway pressure) dependence     setting 13   . Difficult intubation    . Esophageal reflux    . Essential hypertension    . Hyperlipidemia    . Plasma cell tumor (CMS HCC)    . PONV (postoperative nausea and vomiting)    . Rash     rle, told he had psorasis, seen derm, given cream   . Sleep apnea          OSA diagnosed? yes  History of Anticoagulant Use? No    Social History:   Social History     Tobacco Use   . Smoking status: Never Smoker   . Smokeless tobacco: Current User     Types: Snuff   . Tobacco comment: has cut back on snuff use   Substance Use Topics   . Alcohol use: Yes     Alcohol/week: 8.0 standard drinks     Types: 8 Cans of beer per week   . Drug use: No       Past Family History:   Family Medical History:     Problem Relation (Age of Onset)    Hypertension (High Blood Pressure) Father          ROS: Constitutional: negative for recent illnesses   Ears, nose, mouth, throat, and face: negative neck  stiffness  Respiratory: negative for shortness of breath  Cardiovascular: negative for chest pain and dyspnea  Gastrointestinal: negative for nausea, vomiting  Integument/breast: negative for rash and skin lesion  Hematologic/lymphatic: negative for easy bruising and bleeding  Musculoskeletal: negative for neck pain  Neurological: negative for numbness and tingling  Allergy: No Known Allergies      Exam:   Temperature: 36.7 C (98.1 F)  Heart Rate: 72  BP (Non-Invasive): 122/66  Respiratory Rate: 18  SpO2: 99 %  General: appears in good health  HENT: Head atraumatic. normocephalic, ENT without erythema or injection, mucous membranes moist.  Neck: supple, symmetrical, trachea midline.  Lungs: Non-labored breathing.  Cardiovascular: regular rate and rhythm. Appears well perfused.   Extremities: extremities normal, atraumatic, no cyanosis or edema  Skin: No rashes or lesions  Neurologic: Alert and oriented x3. No gross motor or sensory deficits in bilateral extremities.  Psychiatric: Normal affect, behavior, speech      Labs:  I have reviewed all lab results.    Plan:   [name] is a 53 y.o. male who is scheduled for  right ankle fusion with anticipated post-operative pain.  - Offered patient single shot femoral and sciatic peripheral nerve block. Patient agreeable to procedure.   - Scheduled and PRN medications per primary service.   - For any questions please page or call the Acute Perioperative Pain Service at [phone].   - If not already done, please place consult order to Acute Perioperative Pain Service. Thank you for your consultation.         I discussed treatment plan with patient including risks and benefits of procedures and patient consents.       Al Pimple, MD   11/28/2018 07:10  Department of Anesthesiology  Regional Anesthesia Pager [phone]      I saw and examined the patient.  I reviewed the resident's note.  I agree with the findings and plan of care as documented in the resident's note.  Any  exceptions/additions are edited/noted.    Leonard Acton, DO  Suggested Level of Care: level 4

## 2018-11-28 NOTE — Progress Notes (Signed)
[facility]  Orthopaedic Foot/ankle Progress Note    11/28/2018  Day of Surgery S/P  R TTC fusion    Subjective: Pain controlled    Physical Exam:  BP 121/75   Pulse 67   Temp 36.7 C (98.1 F)   Resp 14   Ht 1.829 m (6')   Wt 122.4 kg (269 lb 13.5 oz)   SpO2 99%   BMI 36.60 kg/m       NAD  Nonlabored breathing with symmetric chest rise  Abdomen non distended  Pulses RRR  RLE: No sensation dorsal or plantar foot. WWP foot with BCR. No motor function to toes    Assessment/ Plan:   - NWB RLE  - ASA tomorrow  - Ancef 24  - AM labs  - FU 1 wk  - Discharge tomorrow    Evans Lance, MD 11/28/2018, 09:44  Resident pager [phone]  [place] saw and examined the patient.  I directly supervised the resident's activities and procedures.  I reviewed the resident's note.  I agree with the findings and plan of care as documented in the resident's note.    Herbie Baltimore Kurtis Bushman, MD  Associate Professor  Chief of Foot & Ankle Surgery  Department of [place] [facility]  11/28/2018 1:32 PM

## 2018-11-28 NOTE — Brief Op Note (Signed)
[facility]                                                     BRIEF OPERATIVE NOTE    Patient Name: [name], [name] Number: [id number]  Date of Service: 11/28/2018   Date of Birth: [birth year]    All elements must be documented.    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   Post-Operative Diagnosis: Same  Procedure(s)/Description:  Removal external fixator RLE, explant R abx cement spacer, TTC fusion  Findings: per op note    Attending Surgeon: Irena Cords  Assistant(s): Bostian    Anesthesia Type: General LMA anesthesia  Estimated Blood Loss:  Minimal  Blood Given: none  Fluids Given: per anesthesia record  Complications (not routinely expected or not inherent to difficulty/nature of procedure):none apparent  Characteristic Event (routinely expected or inherent to the difficulty/nature of the procedure): none  Did the use of current and/or prior Anticoagulants impact the outcome of the case? No  Wound Class: Clean Wound: Uninfected operative wounds in which no inflammation occurred    Tourniquet Time: 98 minutes    Tubes: None  Drains: None  Specimens/ Cultures: None  Implants: Joya Gaskins medical           Disposition: PACU - hemodynamically stable.  Condition: stable      Phillip A. Bostian  Resident  Evans Lance, MD 11/28/2018, 09:42  Pager # [phone]                      I saw and examined the patient.  I directly supervised the resident's activities and procedures.  I reviewed the resident's note.  I agree with the findings and plan of care as documented in the resident's note.    Herbie Baltimore Kurtis Bushman, MD  Associate Professor  Chief of Foot & Ankle Surgery  Department of [place] [facility] Medicine  11/28/2018 1:32 PM

## 2018-11-28 NOTE — Nurses Notes (Signed)
Patient complains of right ankle pain. Medication is not verification yet. Page to pharmacist. Will monitor.

## 2018-11-28 NOTE — Nurses Notes (Signed)
Bone stimulator given by rep

## 2018-11-28 NOTE — Consults (Signed)
INFECTIOUS DISEASE FOLLOW UP CONSULTATION    Patient Name: [facility] Number: [id number]  Date of Service: 11/28/2018  Date of Birth: 1966/06/26    Hospital Day:  LOS: 0 days     Reason for Consultation: History of right prosthetic ankle infection    Background: 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  The patient always had some pain in his right ankle postoperatively.  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  He is currently in remission.  The patient subsequently presented to the emergency department at [facility] on 08/13/2018 with acute right ankle pain and erythema.  Following discharge from the emergency department, he went to [facility], where he received oral clindamycin for presumed cellulitis.  He returned with ongoing symptoms 3 days later, when he was hospitalized and received vancomycin and ceftriaxone through 08/21/2018.  He was referred to [facility] on 08/23/2018, when he was admitted to our facility.  He underwent irrigation and debridement of right lower extremity abscess and multiple areas of infection, explantation of right total ankle arthroplasty, and application of right lower extremity multiplanar external fixator on 08/26/2018, when operative cultures grew Streptococcus dysgalactiae.  Blood cultures at the outside hospital and on admission to our facility on 08/23/2018 were negative.  The patient was evaluated by the inpatient Infectious Diseases Service.  As operative cultures were felt likely to have been confounded by prior antimicrobial therapy, the patient was recommended for a 6  week course of vancomycin and cefepime postoperatively.  The patient completed vancomycin and cefepime on 10/06/2018.  He subsequently followed up in [facility] on 10/25/2018, when he was found to be doing well off antimicrobial therapy without signs of infection or issues with incisions for a period of approximately 3 weeks.  Laboratory testing on 10/25/2018 revealed WBC 8,400 with 67% neutrophils and CRP 8.1.  The patient was admitted to [facility] on 11/28/2018 for elective removal of right lower extremity external fixator, explant of right antibiotic cement spacer, and tibiotalocalcaneal fusion, which was preformed today.  Intraoperatively, there was reportedly no concern for infection and operative cultures were not obtained.  The patient has been receiving perioperative cefazolin.    Subjective: [name] reports overall doing well postoperatively.  He reports his pain is controlled.  He denies having any infectious issues since completing antimicrobial therapy on 10/06/2018 during the weeks leading up to his elective surgery.  He currently denies fevers and chills.  He relates no complaints.    Objective:  Physical Exam:  Vitals: BP 116/70   Pulse 78   Temp 37.2 C (99 F)   Resp 18   Ht 1.829 m (6' 0.01")   Wt 122.4 kg (269 lb 13.5 oz)   SpO2 94%   BMI 36.59 kg/m   General: No acute distress.  He appears well .  He does not look toxic.  Eyes: Pupils equal round and reactive bilaterally.  Conjunctivae clear.  No scleral icterus.  ENT: Face symmetrical.  Mucous membranes pink and moist.  No oral lesions or candidiasis.  Neck: Supple and symmetrical.  Lungs: Clear to auscultation bilaterally.  No wheezes, rhonchi, or crackles.  Unlabored  respirations.  Cardiovascular: Regular rate and rhythm.  No murmur.  Abdomen: Normoactive bowel sounds.  Nondistended, soft, and nontender to palpation.  Extremities: Right ankle fused and dressed/wrapped in an ACE bandage  postoperatively.  Minimal serosanguinous drainage on posterior aspect of dressing.  No edema in remaining extremities.  Skin: Warm and dry.  No rash.  Right ankle surgical site dressed as previously noted.  Neurologic: Alert and oriented X 3.  Cranial nerves II-XII are intact.     Inpatient Medications:  atorvastatin (LIPITOR) tablet, 10 mg, Oral, Daily  ceFAZolin (ANCEF) 3 g in D5W 100 mL IVPB, 3 g, Intravenous, Q8H  LORazepam (ATIVAN) tablet, 1 mg, Oral, HS PRN  LR premix infusion, , Intravenous, Continuous  morphine 4 mg/mL injection, 4 mg, Intravenous, Q2H PRN  NS flush syringe, 2 mL, Intracatheter, Q8HRS    And  NS flush syringe, 2-6 mL, Intracatheter, Q1 MIN PRN  NS flush syringe, 2 mL, Intracatheter, Q8HRS    And  NS flush syringe, 2-6 mL, Intracatheter, Q1 MIN PRN  NS flush syringe, 2 mL, Intracatheter, Q8HRS    And  NS flush syringe, 2-6 mL, Intracatheter, Q1 MIN PRN  NS premix infusion, , Intravenous, Continuous  ondansetron (ZOFRAN) 2 mg/mL injection, 4 mg, Intravenous, Q6H PRN  oxyCODONE-acetaminophen (PERCOCET) 5-325mg  per tablet, 1 Tab, Oral, Q4H PRN  oxyCODONE-acetaminophen (PERCOCET) 5-325mg  per tablet, 2 Tab, Oral, Q4H PRN  sennosides-docusate sodium (SENOKOT-S) 8.6-50mg  per tablet, 2 Tab, Oral, 2x/day  [START ON 11/29/2018] spironolactone (ALDACTONE) tablet, 25 mg, Oral, Daily with Breakfast    Current Antimicrobials:  Cefazolin 3 g IV Q8 hours perioperatively    Lines:  Peripheral IV line    Laboratory Studies:  No laboratory testing has been performed this admission.    Microbiology:  All prior microbiology results have been reviewed.  No operative cultures were obtained today.    Assessment: [name] is a 53 year-old male who initially underwent right total ankle arthroplasty, right subtalar fusion, and right talonavicular fusion on 11/05/2014.  After being diagnosed with plasma cell cytoma and receiving radiation therapy, the patient developed right ankle pain and erythema ultimately  diagnosed as a right ankle prosthetic joint infection.  The patient underwent irrigation and debridement of right lower extremity abscess and multiple areas of infection, explantation of right total ankle arthroplasty, and application of right lower extremity multiplanar external fixator on 08/26/2018.  Operative cultures grew Streptococcus dysgalactiae.  However, as the patient had received both oral and intravenous antimicrobial therapy preoperatively, polymicrobial infection was unable to be excluded.  The patient completed a 6 week course of vancomycin and cefepime on 10/06/2018, and he subsequently demonstrated no evidence of infection while off antimicrobial therapy.  The patient is currently admitted for elective removal of right lower extremity external fixator, explant of right antibiotic cement spacer, and tibiotalocalcaneal fusion, which was preformed on 11/28/2018.  There was no evidence of active infection intraoperatively.  No new operative cultures were obtained.  Postoperatively, the patient is doing well clinically.  As the patient's right prosthetic ankle infection has previously been appropriately treated and there is no evidence of ongoing infection, the patient does not need additional antimicrobial therapy at this time.    Recommendations:  1. There is no need for additional antimicrobial therapy at this time.  The patient has completed appropriate surgical and antimicrobial therapy for his history of right ankle prosthetic joint infection.  2. There is no need for follow up with Infectious Diseases in the outpatient setting  in this time.  However, we would be happy to see the patient in follow up if it is desired by the patient or Orthopedic Surgery or if a new issue arises.  In this case, the patient will be scheduled for follow up in [facility].    We will sign off at this time.  Please call or page the Infectious Diseases Service with any questions or concerns  regarding this patient.    Evangeline Dakin, M.D., 11/28/2018  Infectious Diseases Fellow  [place] [facility]      11/29/2018  I saw and examined the patient.  I reviewed the fellow's note.  I agree with the findings and plan of care as documented in the fellow's note.  Any exceptions/additions are edited/noted.    Stana Bunting, MD

## 2018-11-28 NOTE — Anesthesia Procedure Notes (Addendum)
Block: Peripheral Block     Sedation  The patient was continuously monitored throughout the procedure and in recovery. I was in attendance and supervised the sedation (during the start and stop times listed below) and remained immediately available until the patient returned to pre-procedure baseline.  Audelia Acton, DO 11/28/2018, 16:11  Sedation Start Time  11/28/2018 6:56 AM   Sedation Stop Time 11/28/2018 7:07 AM  Blocks  Block Type: sciatic-classic     Indication: Requested by surgeon and Acute post operative pain  Laterality: Right Pt location: at Bedside  Requesting Surgeon: Fulton Reek, MD  Site verified, H&P updated and consent obtained, Patient monitors applied, Timeout performed, Emergency drugs and equipment available, Patient positioned and anesthesia consent given  Technique(See MAR for doses)  Type of Block: single shot  Approach:  [place]      Preprocedure hand washing was performed sterile field maintained    Ultrasound used for needle placement, imaging, supervision, and interpretation. Ultrasound image archived  Nerve stimulator used :with twitch faded at the current intensity of 0.58mA  Sterile Skin Prep : Aseptic technique, Sterile gloves, Supplies assembled on sterile field, Mask, cap, Sterile technique and Hand hygiene performed    Skin prepped with: Chlorhexidine gluconate and isopropyl alcohol    Skin Local   lidocaine 1%   Needle  Needle type: Stimuplex     Needle Gauge: 21G   Needle length: 4 in.  Needle localization: ultrasound guidance, nerve stimulator and anatomical landmarks  Number of attempts: 1      Catheter          Medications    Assessment  Injection assessment: incremental injection, local visualized surrounding nerve on ultrasound and negative aspiration for heme  Paresthesia pain: none    Heart Rate changed: No    Events     Patient tolerance of procedure: tolerated well, no immediate complications        NOTES: Continuous ultrasound was used to guide the needle tipin  close proximity of the neural target. There was minimal disruption of musculoskeletal and vascular structures with exceptions as noted. An in-plane short-axis image was saved to the patient's medical record. Appropriate hydrodissection was achieved and no intraneural injection was appreciated/occurred.    CPT Code [phone]            Performed By:  Authorizing Provider:  Audelia Acton, DO  Performing Provider:  Al Pimple, MD  I was present and supervised/observed the entire procedure.  Audelia Acton, DO 11/28/2018, 16:11

## 2018-11-28 NOTE — Anesthesia Preprocedure Evaluation (Addendum)
ANESTHESIA PRE-OP EVALUATION  Planned Procedure: ARTHRODESIS FOOT (Right )  Review of Systems  Anesthesia Complications comment: Pt denies PONV. No previous AFOI. Asleep fiberoptic placed uneventfully most recently.   difficult intubation cart should be present at induction and PONV       patient summary reviewed          Pulmonary   sleep apnea and CPAP,   Cardiovascular    Hypertension and well controlled No peripheral edema,  Exercise Tolerance: > or = 4 METS        GI/Hepatic/Renal    GERD and well controlled     Endo/Other          Neuro/Psych/MS        Cancer                   Physical Assessment      Patient summary reviewed   Airway       Mallampati: III    TM distance: >3 FB    Neck ROM: full  Mouth Opening: good.            Dental       Dentition intact             Pulmonary    Breath sounds clear to auscultation  (-) no rhonchi, no decreased breath sounds, no wheezes, no rales and no stridor     Cardiovascular    Rhythm: regular  Rate: Normal  (-) no friction rub, no peripheral edema and no murmur     Other findings            Plan  Planned anesthesia type: regional block and GA /LMA        ASA 2     Intravenous induction   Pre-op Block  Anesthetic plan and risks discussed with patient.     Anesthesia issues/risks discussed are: Dental Injuries, Nerve Injuries, Local Anesthetic Systemic Toxicity, Failure of Block, Sore Throat, Difficult Airway, Blood Loss, Aspiration, Intraoperative Awareness/ Recall, Stroke, PONV and Cardiac Events/MI.    Use of blood products discussed with patient who consented to blood products.     Patient's NPO status is appropriate for Anesthesia.           Plan discussed with CRNA.          Hct 38.9   Plt 306   Na 139   K 3.8   Cr 0.90   Nml LFT's  CXR WNL  EKG: NSR with left-axis deviation    -Plan for FOB equipment to be in room in prepared.   -Planning for GA/LMA following pre-op regional block.   -Postop narcotic if necessary.

## 2018-11-28 NOTE — Anesthesia Procedure Notes (Addendum)
Block: Peripheral Block     Sedation  The patient was continuously monitored throughout the procedure and in recovery. I was in attendance and supervised the sedation (during the start and stop times listed below) and remained immediately available until the patient returned to pre-procedure baseline.  Audelia Acton, DO 11/28/2018, 16:11  Sedation Start Time  11/28/2018 6:56 AM   Sedation Stop Time 11/28/2018 7:07 AM  Blocks  Block Type: femoral   Diagnosis: ankle pain   Indication: Requested by surgeon and Acute post operative pain  Laterality: Right Pt location: at Bedside  Requesting Surgeon: Fulton Reek, MD  Site verified, H&P updated and consent obtained, Patient monitors applied, Timeout performed, Emergency drugs and equipment available, Patient positioned and anesthesia consent given  Technique(See MAR for doses)  Type of Block: single shot        Preprocedure hand washing was performed sterile field maintained    Ultrasound used for needle placement, imaging, supervision, and interpretation. Ultrasound image archived  Nerve stimulator used :with twitch faded at the current intensity of 0.33mA  Sterile Skin Prep : Supplies assembled on sterile field, Sterile gloves, Sterile technique, Hand hygiene performed, Sterile field established and Mask    Skin prepped with: Chlorhexidine gluconate and isopropyl alcohol    Skin Local      Needle  Needle type: Stimuplex     Needle Gauge: 21G   Needle length: 4 in.    Number of attempts: 1      Catheter          Medications    Assessment  Injection assessment: incremental injection, local visualized surrounding nerve on ultrasound and negative aspiration for heme  Paresthesia pain: none    Heart Rate changed: No    Events     Patient tolerance of procedure: tolerated well, no immediate complications        NOTES: Continuous ultrasound was used to guide the needle tipin close proximity of the neural target. There was minimal disruption of musculoskeletal and vascular  structures with exceptions as noted. An in-plane short-axis image was saved to the patient's medical record. Appropriate hydrodissection was achieved and no intraneural injection was appreciated/occurred.    CPT Code [phone]            Performed By:  Authorizing Provider:  Audelia Acton, DO  Performing Provider:  Al Pimple, MD  I was present and supervised/observed the entire procedure.  Audelia Acton, DO 11/28/2018, 16:11

## 2018-11-29 DIAGNOSIS — T8459XA Infection and inflammatory reaction due to other internal joint prosthesis, initial encounter: Secondary | ICD-10-CM

## 2018-11-29 LAB — BASIC METABOLIC PANEL
ANION GAP: 7 mmol/L (ref 4–13)
BUN/CREA RATIO: 13 (ref 6–22)
BUN: 13 mg/dL (ref 8–25)
CALCIUM: 8.8 mg/dL (ref 8.5–10.2)
CHLORIDE: 106 mmol/L (ref 96–111)
CO2 TOTAL: 26 mmol/L (ref 22–32)
CREATININE: 0.98 mg/dL (ref 0.62–1.27)
ESTIMATED GFR: >60 mL/min/1.73mˆ2 (ref >60)
GLUCOSE: 163 mg/dL — ABNORMAL HIGH (ref 65–139)
POTASSIUM: 4.1 mmol/L (ref 3.5–5.1)
SODIUM: 139 mmol/L (ref 136–145)

## 2018-11-29 MED ADMIN — sodium chloride 0.9 % (flush) injection syringe: ORAL | @ 06:00:00

## 2018-11-29 MED ADMIN — spironolactone 25 mg tablet: ORAL | @ 08:00:00

## 2018-11-29 MED ADMIN — Medication: INTRAVENOUS | @ 06:00:00

## 2018-11-29 MED ADMIN — levETIRAcetam 1,000 mg tablet: @ 06:00:00

## 2018-11-29 NOTE — Nurses Notes (Signed)
Pt A&Ox4. Educated pt on DC instructions. Pt verbalized understanding using read back method. Sig other at bedside. IV lock removed, see flowsheet. DC pharmacy delivered medications ordered by MD. Pt denies pain at present time. Ace bandage CD&I. Pt leaving via wc with central transport. Provided emotional support.

## 2018-11-29 NOTE — Nurses Notes (Signed)
Patient can now wiggle toes and can feel light touch although toes remain numb. Administered 5-325mg  Percocet for 4\10 RLE pain.

## 2018-11-29 NOTE — Progress Notes (Signed)
[place]  Orthopaedic Foot/ankle Progress Note    11/29/2018  1 Day Post-Op S/P  R TTC fusion    Subjective: No interval events and pain controlled overnight.     Physical Exam:  BP 116/73   Pulse 57   Temp 36.8 C (98.2 F)   Resp 18   Ht 1.829 m (6' 0.01")   Wt 122.4 kg (269 lb 13.5 oz)   SpO2 95%   BMI 36.59 kg/m       NAD  Nonlabored breathing with symmetric chest rise  Abdomen non distended  Pulses RRR  RLE: SILT dorsal and plantar foot. WWP foot with BCR. Moves all toes    Assessment/ Plan:   - NWB RLE  - ASA  - Ancef 24  - AM labs completed and reviewed - acute blood loss anemia  - FU 1 wk  - Discharge today    Evans Lance, MD 11/29/2018, 05:30  Resident pager [phone]  [place] saw and examined the patient.  I directly supervised the resident's activities and procedures.  I reviewed the resident's note.  I agree with the findings and plan of care as documented in the resident's note.    Herbie [place] Kurtis Bushman, MD  Associate Professor  Chief of Foot & Ankle Surgery  Department of [place] [facility]  11/29/2018 7:31 AM

## 2018-11-29 NOTE — Care Management Notes (Signed)
09:28  The patient was discharged prior to care management initial evaluation and patient education on observation status.  Thurnell Lose, CLINICAL CARE COORDINATOR  11/29/2018, 09:29

## 2018-11-29 NOTE — Nurses Notes (Signed)
Ortho resident at bedside changing RLE dressing. Additional 5-325mg  Percocet given for pain at this time. Discharge orders received. Patient states his wife will be in later this morning and bring his knee scooter for discharge.

## 2018-11-29 NOTE — Nurses Notes (Signed)
Assessment completed. No needs at this time. RLE remains elevated with new blood drainage noted.

## 2018-11-29 NOTE — Care Plan (Signed)
Problem: Adult Inpatient Plan of Care  Goal: Plan of Care Review  Outcome: Ongoing (see interventions/notes)     Problem: Adult Inpatient Plan of Care  Goal: Optimal Comfort and Wellbeing  Outcome: Ongoing (see interventions/notes)     Problem: Pain Acute  Goal: Optimal Pain Control  Outcome: Ongoing (see interventions/notes)     Patient's pain well controlled overnight. RLE kept elevated with ice, moderate amount of bloody drainage to OR dressing, +CMS checks. NWB restrictions to RLE maintained. Patient anticipating working with PT/OT today and hoping to be discharged home.

## 2018-11-29 NOTE — Nurses Notes (Signed)
RLE remains elevated, ice pack in place. Moderate amount of bloody drainage noted to dressing and pad underneath. Patient states he can longer feel sharp or dull touch to toes and unable to wiggle toes. Toes warm to touch with cap return < 3seconds. Notified Ortho on-call resident whom states he will be rounding in a few hours and will assess at that time. Will notify with any further changes.

## 2018-11-29 NOTE — Nurses Notes (Signed)
RLE elevated, ice pack in place. +CMS check other than patient unable to wiggle toes.

## 2018-11-29 NOTE — Nurses Notes (Signed)
Patient denies any pain, moderate amount of old bloody drainage noted to pad under RLE OR dressing. Patient has numbness/tingling to RLE, warm to touch, and <3 sec cap return but states he is unable to wiggle toes. Gross motor movement noted.

## 2018-12-06 ENCOUNTER — Ambulatory Visit: Payer: BC Managed Care – PPO

## 2018-12-06 ENCOUNTER — Encounter (INDEPENDENT_AMBULATORY_CARE_PROVIDER_SITE_OTHER): Payer: Self-pay

## 2018-12-06 ENCOUNTER — Other Ambulatory Visit: Payer: Self-pay

## 2018-12-06 DIAGNOSIS — M19079 Primary osteoarthritis, unspecified ankle and foot: Secondary | ICD-10-CM

## 2018-12-06 DIAGNOSIS — Z4789 Encounter for other orthopedic aftercare: Secondary | ICD-10-CM | POA: Insufficient documentation

## 2018-12-06 DIAGNOSIS — M19071 Primary osteoarthritis, right ankle and foot: Secondary | ICD-10-CM | POA: Insufficient documentation

## 2018-12-06 NOTE — Progress Notes (Signed)
PATIENT NAMESUHAS, [name]   HOSPITAL NUMBER:  [id number]  DATE OF SERVICE: 12/06/2018  DATE OF BIRTH:  11-Aug-1966    PROGRESS NOTE    SUBJECTIVE:  [name] is a 53 year old male who returns to clinic for first postoperative visit following removal of external fixator of the right ankle, explant of his right ankle antibiotic cement spacer, and conversion to a TTC fusion using a femoral head allograft.  This was done November 28, 2018 with Dr. Irena Cords.  He has been doing well.  Pain has been well controlled.  He has been nonweightbearing.  He has been in a splint and taking aspirin.    OBJECTIVE:  On examination of the patient's right ankle, his incisions are well approximated.  No erythema, induration or discharge.  Mild swelling is present.  Normal sensation dorsal and plantar foot.  Moves all of his toes and brisk capillary refill to his foot.    ASSESSMENT:  Status post the above-listed procedure, doing well.    PLAN:  The patient was placed into a cast at today's visit.  He is to remain nonweightbearing.  Continue his aspirin.  We will see him back in 2 weeks with out of cast examination.        Evans Lance, MD  Resident   [facility] Department of Orthopaedics       Fulton Reek, MD  Associate Professor   [place] [facility] [place]              DD:  12/06/2018 09:14:20  DT:  12/06/2018 21:06:13 LA  D#:  [id number]  I saw and examined the patient.  I directly supervised the resident's activities and procedures.  I reviewed the resident's note.  I agree with the findings and plan of care as documented in the resident's note.    Herbie Baltimore Kurtis Bushman, MD  Associate Professor  Chief of Foot & Ankle Surgery  Department of [place] [facility]  12/13/2018 7:18 AM

## 2018-12-06 NOTE — Patient Instructions (Signed)
[facility]                                     CAST CARE INSTRUCTIONS          1.  Ice yourinjured extremity for 1 -hour periods (if you have a cast) 4-5 times per day for                 at least 2 days. If you do not have a cast, place ice on for 20-30 minutes.           2.  Place ice in a plastic bag andwrap with a pillowcase ortowel to prevent cast from                  getting wet. Never put ice directly on your skin!           3.  Elevate your injured extremity above the level of your heart continuously for several                   days.                          Call [phone] immediately for the following warning signs: new numbness, severe                   swelling with loss of finger or toe motion, or the extremity becomes white, blue, or                   cold, increase in pain or rubbing,Symptoms not relieved by elevation and rest.           5.  Rewrap elastic bandage if splint becomes too tight.           6.  Keep cast, splint and wounds dry. Lacerations should be kept clean and dry, keepon untilinstructed to be removed. If dressing becomes wet or dirty, please                  call [phone].           7.  Protect cast while bathing to keep it from getting wet (cover with plastic bag). If castwet, [phone].           8.  Expose cast to air to promote drying. It takes 48 hours for a plaster cast to completely                 dry. During this time it is important to support the entire length of the cast on apillowreduce chance of denting. Do not step down on a plaster cast for 48 hours, then                  weight bear as instructed by physician. Use crutches or walker for walking.           9.  Do not pull cast padding from inside the cast. An itchingsensation under the cast is                  normal. However, if it becomes troublesome, consult your doctor. Never insert                 objects under the cast.              Do not    alter, trim or physically abusethe cast. This may damagethe cast and directly            affect the time needed for healing.              If you want to autograph or decorate the cast, use porous paints only.                             CONTACT NUMBERS    Clinic: [phone]                      Healthline: [phone] or [phone]

## 2018-12-06 NOTE — Procedures (Signed)
[facility]  [place] [address]  [phone]  [phone]    Patient Name: [name]  MRN: [id number]  Date: 12/06/2018    Orthopaedic Faculty: Fulton Reek, MD  FG SLC was applied to the right LE.  Instructions were given to patient and family.  Specific instructions were given on proper skin care.  Signs and symptoms of skin problems and/or infection/cast care were reviewed.  Cast care instructions sheet given to the patient.  The patient was instructed to call the Orthopaedic Department with any questions or problems.  Emergency numbers were also given.  The patient/caregiver verbalized understanding of the above.      Cast/device was applied by Meyer Cory, [facility] 12/06/2018, 08:46    Herbie Baltimore Kurtis Bushman, MD  Associate Professor  Chief of Foot & Ankle Surgery  Department of [place] [facility] Medicine  12/06/2018 11:20 AM

## 2018-12-06 NOTE — OR Surgeon (Signed)
PATIENT NAME: [place] NUMBER:  [id number]  DATE OF SERVICE: 11/28/2018  DATE OF BIRTH:  May 05, 1966    OPERATIVE REPORT    PREOPERATIVE DIAGNOSIS:  Right infected total ankle arthroplasty, status post explantation with antibiotic cement spacer.    POSTOPERATIVE DIAGNOSIS:  Right infected total ankle arthroplasty, status post explantation with antibiotic cement spacer.    NAME OF PROCEDURE:  Removal of external fixator and right lower extremity tibiotalar calcaneal intramedullary rod fusion with removal of antibiotic cement spacer and implantation of femoral head allograft.    SURGEON:  Inda Coke, MD.    ASSISTANT:  Evans Lance, MD.    ANESTHESIA:  Regional plus general.    DISPOSITION:  Postanesthesia recovery room stable.    INDICATIONS FOR PROCEDURE:  Mr. Hawes had presented to me with the deformities that we just described.  Under staged procedures, we are going forward with the final portion.    DESCRIPTION OF PROCEDURE:  The patient was identified in the holding area.  The operative extremity was signed by the surgeon.  Intravenous antibiotics were given as prophylaxis.  He was taken to the operative suite and placed supine on the operating table.  All bony prominences were well padded.  Sterile prep and drape of the operative extremity was completed to the thigh.  The thigh tourniquet remained in place the entire procedure.  A time-out was observed.  The operative extremity was approached by making a longitudinal incision opening up the anterior ankle joint,  this is after the sterile prep and drape had been completed after removal of the external fixator done under nonsterile technique.  This ankle incision was taken down through the joint capsule, where we were able to open up the ankle joint and find the cement spacer.  It was removed with a combination of osteotomes, rongeurs and forceps.  We curetted free the area, pulsed lavaged the area and rinsed it well.  Once  we had the joint prepared well, the bone block of the femoral head was matched to the previous artificial joint with the bone jigs.  It was soaked in augment bone graft material and covered grossly as well.  It was predrilled as well, inserted into position and then cannulated using the tibiotalar calcaneal intramedullary space from a plantar incision.  This was the Valor Nail from [facility].  Once we had this in position and properly reamed, we then inserted the proper Valor nail into position.  It was seated and then locked proximally and compressed internally and then locked distally.  Once completed, we copiously irrigated the wounds, packed the remaining bone graft material around the fusion site and closed in layers.  Sterile bandages were applied.  The patient was taken to recovery room stable, neurovascular grossly intact.        Fulton Reek, MD  Associate Professor   [place] [facility] of [place]              DD:  12/06/2018 07:40:20  DT:  12/06/2018 15:58:38 DG  D#:  [id number]

## 2018-12-19 ENCOUNTER — Other Ambulatory Visit (HOSPITAL_BASED_OUTPATIENT_CLINIC_OR_DEPARTMENT_OTHER): Payer: Self-pay | Admitting: Internal Medicine

## 2018-12-19 DIAGNOSIS — C9030 Solitary plasmacytoma not having achieved remission: Secondary | ICD-10-CM

## 2018-12-20 ENCOUNTER — Encounter (HOSPITAL_BASED_OUTPATIENT_CLINIC_OR_DEPARTMENT_OTHER): Payer: Self-pay | Admitting: Internal Medicine

## 2018-12-20 ENCOUNTER — Ambulatory Visit (HOSPITAL_BASED_OUTPATIENT_CLINIC_OR_DEPARTMENT_OTHER): Payer: BC Managed Care – PPO | Admitting: Internal Medicine

## 2018-12-20 ENCOUNTER — Other Ambulatory Visit: Payer: Self-pay

## 2018-12-20 ENCOUNTER — Ambulatory Visit (HOSPITAL_BASED_OUTPATIENT_CLINIC_OR_DEPARTMENT_OTHER)
Admission: RE | Admit: 2018-12-20 | Discharge: 2018-12-20 | Disposition: A | Discharge status: Home / Self Care | Payer: BC Managed Care – PPO | Source: Ambulatory Visit

## 2018-12-20 ENCOUNTER — Encounter (INDEPENDENT_AMBULATORY_CARE_PROVIDER_SITE_OTHER): Payer: Self-pay

## 2018-12-20 ENCOUNTER — Ambulatory Visit (HOSPITAL_BASED_OUTPATIENT_CLINIC_OR_DEPARTMENT_OTHER): Payer: BC Managed Care – PPO

## 2018-12-20 ENCOUNTER — Ambulatory Visit
Admission: RE | Admit: 2018-12-20 | Discharge: 2018-12-20 | Disposition: A | Discharge status: Home / Self Care | Payer: BC Managed Care – PPO | Source: Ambulatory Visit | Attending: Internal Medicine | Admitting: Internal Medicine

## 2018-12-20 VITALS — BP 106/62 | HR 118 | Temp 97.1°F | Resp 18 | Ht 72.01 in | Wt 269.4 lb

## 2018-12-20 DIAGNOSIS — C9030 Solitary plasmacytoma not having achieved remission: Secondary | ICD-10-CM | POA: Insufficient documentation

## 2018-12-20 DIAGNOSIS — M19079 Primary osteoarthritis, unspecified ankle and foot: Secondary | ICD-10-CM

## 2018-12-20 DIAGNOSIS — D649 Anemia, unspecified: Secondary | ICD-10-CM | POA: Insufficient documentation

## 2018-12-20 DIAGNOSIS — Z981 Arthrodesis status: Secondary | ICD-10-CM | POA: Insufficient documentation

## 2018-12-20 DIAGNOSIS — Z720 Tobacco use: Secondary | ICD-10-CM | POA: Insufficient documentation

## 2018-12-20 DIAGNOSIS — M19071 Primary osteoarthritis, right ankle and foot: Secondary | ICD-10-CM

## 2018-12-20 LAB — CBC WITH DIFF
BASOPHIL #: <0.1 x10ˆ3/uL (ref <=0.20)
BASOPHIL %: 1 %
EOSINOPHIL #: 0.3 x10ˆ3/uL (ref <=0.50)
EOSINOPHIL %: 3 %
HCT: 38.6 % — ABNORMAL LOW (ref 38.9–52.0)
HGB: 12.3 g/dL — ABNORMAL LOW (ref 13.4–17.5)
IMMATURE GRANULOCYTE #: <0.1 x10ˆ3/uL (ref <0.10)
IMMATURE GRANULOCYTE %: 1 % (ref 0–1)
LYMPHOCYTE #: 1.68 x10ˆ3/uL (ref 1.00–4.80)
LYMPHOCYTE %: 17 %
MCH: 26.4 pg (ref 26.0–32.0)
MCHC: 31.9 g/dL (ref 31.0–35.5)
MCV: 82.8 fL (ref 78.0–100.0)
MONOCYTE #: 0.59 10*3/uL (ref 0.20–1.10)
MONOCYTE %: 6 %
MPV: 10 fL (ref 8.7–12.5)
NEUTROPHIL #: 7.13 10*3/uL (ref 1.50–7.70)
NEUTROPHIL #: 7.13 10*3/uL (ref 1.50–7.70)
NEUTROPHIL %: 72 %
PLATELETS: 377 10*3/uL (ref 150–400)
PLATELETS: 377 x10?3/uL (ref 150–400)
RBC: 4.66 x10ˆ6/uL (ref 4.50–6.10)
RDW-CV: 14.5 % (ref 11.5–15.5)
WBC: 9.8 x10ˆ3/uL (ref 3.7–11.0)

## 2018-12-20 LAB — CARBON DIOXIDE (CO2, BICARBONATE): CO2 TOTAL: 24 mmol/L (ref 22–32)

## 2018-12-20 LAB — PHOSPHORUS: PHOSPHORUS: 3.3 mg/dL (ref 2.4–4.7)

## 2018-12-20 LAB — AST (SGOT): AST (SGOT): 11 U/L (ref 8–48)

## 2018-12-20 LAB — CREATININE WITH EGFR
CREATININE: 1.03 mg/dL (ref 0.62–1.27)
ESTIMATED GFR: >60 mL/min/1.73mˆ2 (ref >60)

## 2018-12-20 LAB — BUN: BUN: 24 mg/dL (ref 8–25)

## 2018-12-20 LAB — SODIUM: SODIUM: 136 mmol/L (ref 136–145)

## 2018-12-20 LAB — ALT (SGPT): ALT (SGPT): 13 U/L (ref <55)

## 2018-12-20 LAB — PLATELETS AND ANC CANCER CENTER
NEUTROPHILS #: 7.13 x10ˆ3/uL (ref 1.50–7.70)
PLATELET COUNT: 377 x10ˆ3/uL (ref 150–400)

## 2018-12-20 LAB — ALBUMIN FOR ELECTROPHORESIS: ALBUMIN: 3.7 g/dL (ref 3.5–5.0)

## 2018-12-20 LAB — BILIRUBIN TOTAL: BILIRUBIN TOTAL: 0.4 mg/dL (ref 0.3–1.3)

## 2018-12-20 LAB — IMMUNOGLOBULIN A (IGA), SERUM: IMMUNOGLOBULIN A (IGA): 262 mg/dL (ref 85–499)

## 2018-12-20 LAB — PROTEIN FOR ELECTROPHORESIS: PROTEIN TOTAL: 7.6 g/dL (ref 6.0–7.9)

## 2018-12-20 LAB — KAPPA AND LAMBDA FREE LIGHT CHAINS, SERUM
KAPPA FREE LIGHT CHAINS: 6.49 mg/dL — ABNORMAL HIGH (ref 0.33–1.94)
KAPPA/LAMBDA FLC RATIO: 6.43 — ABNORMAL HIGH (ref 0.26–1.65)
LAMBDA FREE LIGHT CHAINS: 1.01 mg/dL (ref 0.57–2.63)

## 2018-12-20 LAB — ALK PHOS (ALKALINE PHOSPHATASE): ALKALINE PHOSPHATASE: 127 U/L — ABNORMAL HIGH (ref 45–115)

## 2018-12-20 LAB — ALBUMIN: ALBUMIN: 3.7 g/dL (ref 3.5–5.0)

## 2018-12-20 LAB — LDH: LDH: 146 U/L (ref 125–220)

## 2018-12-20 LAB — CALCIUM: CALCIUM: 9.5 mg/dL (ref 8.5–10.2)

## 2018-12-20 LAB — IMMUNOGLOBULIN G (IGG), SERUM
IMMUNOGLOBULIN G (IGG): 1570 mg/dL (ref 610–1616)
IMMUNOGLOBULIN G (IGG): 1570 mg/dL (ref 610–1616)

## 2018-12-20 LAB — POTASSIUM: POTASSIUM: 4.1 mmol/L (ref 3.5–5.1)

## 2018-12-20 LAB — IMMUNOGLOBULIN M (IGM), SERUM: IMMUNOGLOBULIN M (IGM): 39 mg/dL (ref 35–242)

## 2018-12-20 LAB — CHLORIDE: CHLORIDE: 102 mmol/L (ref 96–111)

## 2018-12-20 LAB — BETA-2 MICROGLOBULIN (B2M), SERUM: BETA-2 MICROGLOBULIN, SERUM: 2.47 ug/mL — ABNORMAL HIGH (ref 0.80–2.34)

## 2018-12-20 LAB — TOTAL PROTEIN: PROTEIN TOTAL: 8.3 g/dL (ref 6.4–8.3)

## 2018-12-20 LAB — URIC ACID: URIC ACID: 8.2 mg/dL — ABNORMAL HIGH (ref 3.5–7.5)

## 2018-12-20 NOTE — Nurses Notes (Signed)
0937: Patient to VAD for labs. Labs obtained via SS and sent for processing. Patient will return for clinic visit with Dr. Harrington Challenger at 1pm, instructed patient to recheck in at front desk when he returns. Meta Hatchet, RN

## 2018-12-20 NOTE — Addendum Note (Signed)
Addended by: Manfred Arch on: 12/20/2018 11:53 AM     Modules accepted: Orders

## 2018-12-20 NOTE — Cancer Center Note (Signed)
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 male who is here for follow up. The patient is accompanied by family who provided some of the history.     - 10/2017: patient had a worsening aching back pain and R hip pain that he went to the chiropractor for treatment x3  - 02/14/2018: slip and near fall in his shop:  CT pelv w/o c showing 4.6x2.5x2.6 cm lytic lesion in the R ischium  - 03/18/2018: MRI pelvis w/o c: R ischium from previous CT has a solid appearance. Recommend biopsy  -03/24/2018: R ischial bone biopsy with flow cytometry showing 37.8% abnormal CD 56+ plasma cell. Findings compatible with plasma cell neoplasm that is plasmacytoma  -04/04/2018: B2 microglobulin 2.3, M spike 1.14, IGG 1910 mg/dl, IgA 265 mg/dl, IgM 33 mg/dl, Kappa FLC: 72.9, Lambda FLC: 10.0, K/L: 7.29. IFE: 2 restricted bands in IgA and kappa lanes and IgG and kappa lanes. BMB showing 5% plasma cells with kappa predominance. Normocellular marrow (40%). FISH notable for 1q21, +9, +15 indicating high risk disease  - 05/04/2018: PET/CT: multiple foci of FDG avidity involving R parotid gland. FDG avid lesion in the R ischium/R acetabular region   - 05/11/2018-06/14/2018: 5000 cGy in 25 fx  - 06/2018: M spike 1.02, IGG 1730 mg/dl, IgA 232 mg/dl, IgM 34 mg/dl, Kappa FLC: 70.5, Lambda FLC: 10.8, K/L: 6.53.  - 08/13/2018-08/23/2018: R ankle pain with diagnosis of R ankle cellulitis/joint infection. IV Abx completed 10/07/18  - 12/20/2018: R ankle cast removed and off non-weight bearing. Establishes care with [facility] malignant hematology clinic    He states that he has been having pain in his R pelvis since last January. In April, the work-up  began shortly after he slipped on water in his shop and nearly fell with resulting worsening pain in his R hip. He went to the [facility] hospital with x-ray performed and subsequent symptomatic pain control. He was not content with this and proceeded to [facility] where he had a CT pelvis study which revealed a 4.6 x 2.5 x 2.6cm lytic lesion. The rest of his w/u as described above.    He has been dealing with fluid build up behind his eye for the past year that he is taking a diuretic and following with ophthalmology.    He has been non-weight bearing since October and today had his R ankle cast removed and is now able to ambulate fully. He states that his ankle pain is 1/10 and was a 9/10 at its max last year.     No recent imaging per the pt.    REVIEW OF SYSTEMS:  General: (+) 1/10 ankle pain. (-) fevers (-) chills. (+) weight loss: 290 to 269lbs (-) fatigue.  Lymphatic: (-) palpable masses. (-) night sweats.  Heme: (-) easy bruising (-) bleeding.  (-) recurrent infections.   HEENT. (-) vision (-) hearing changes. (-) dysphagia. (-) sore throat.   Heart: (-) chest pain. (-) palpitation. (-) orthopnea. (-) LE edema.   Lungs: (-) dyspnea (on exertion) (-) hemoptysis. (-) cough.  Abdomen: (-) poor appetite. (-) abdominal pain. (-) nausea (-) vomiting. (-) diarrhea. (-) constipation.   GU: (-) dysuria (-) Urgency. (-) Hematuria.   MS. (-) joint pain (-) ext swelling. (-) Back pain.    Dermatologic: (-) rashes. (-) pruritus.   Psychiatric: (-) Depression. (-) anxiety. (-) insomnia.   Neurologic: (-) headaches. (-) neuropathy. (-) weakness. (-) memory problems.  Other review of systems negative.     PAST MEDICAL HISTORY:  Past Medical History:   Diagnosis Date   . Arthropathy    . Cancer (CMS [place])    . CPAP (continuous positive airway pressure) dependence     setting 13   . Difficult intubation    . Esophageal reflux    . Essential hypertension    . Hyperlipidemia    . Plasma cell tumor (CMS HCC)    .  PONV (postoperative nausea and vomiting)    . Rash     rle, told he had psorasis, seen derm, given cream   . Sleep apnea    Morbid obesity      PAST SURGICAL HISTORY:  Past Surgical History:   Procedure Laterality Date   . HX KNEE SURGERY      right   . HX OTHER      right ankle x3   . HX TONSILLECTOMY     R ankle prosthetic with multiple surgeries (most recently 10/2014),       SOCIAL HISTORY:  Social History     Socioeconomic History   . Marital status: Married     Spouse name: Not on file   . Number of children: Not on file   . Years of education: Not on file   . Highest education level: Not on file   Occupational History   . Not on file   Social Needs   . Financial resource strain: Not on file   . Food insecurity     Worry: Not on file     Inability: Not on file   . Transportation needs     Medical: Not on file     Non-medical: Not on file   Tobacco Use   . Smoking status: Never Smoker   . Smokeless tobacco: Current User     Types: Snuff   . Tobacco comment: has cut back on snuff use   Substance and Sexual Activity   . Alcohol use: Yes     Alcohol/week: 8.0 standard drinks     Types: 8 Cans of beer per week   . Drug use: No   . Sexual activity: Not on file   Lifestyle   . Physical activity     Days per week: Not on file     Minutes per session: Not on file   . Stress: Not on file   Relationships   . Social Product manager on phone: Not on file     Gets together: Not on file     Attends religious service: Not on file     Active member of club or organization: Not on file     Attends meetings of clubs or organizations: Not on file     Relationship status: Not on file   . Intimate partner violence     Fear of current or ex partner: Not on file     Emotionally abused: Not on file     Physically abused: Not on file     Forced sexual activity: Not on file  Other Topics Concern   . Ability to Walk 1 Flight of Steps without SOB/CP Yes   . Routine Exercise No   . Ability to Walk 2 Flight of Steps without SOB/CP Yes    . Unable to Ambulate No   . Total Care No   . Ability To Do Own ADL's Yes     Comment: limited due to difficulty walking   . Uses Walker No   . Other Activity Level Yes   . Uses Cane No   Social History Narrative   . Not on file   Denies nicotine abuse. Endorses 1 can chewing tobacco every 2 days (40 years). 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       FAMILY HISTORY:  Family Medical History:     Problem Relation (Age of Onset)    Hypertension (High Blood Pressure) Father      pUncle: cancer (unsure which type)  mUncle: lung cancer    ALLERGIES:  No Known Allergies     MEDICATIONS:  Current Outpatient Medications   Medication Sig   . AMLODIPINE BES/OLMESARTAN MED (AZOR ORAL) Take by mouth Once a day AM   . atorvastatin (LIPITOR) 10 mg Oral Tablet Take 10 mg by mouth Once a day   . celecoxib (CELEBREX) 200 mg Oral Capsule Take 1 Cap (200 mg total) by mouth Twice daily for 30 days   . colchicine 0.6 mg Oral Tablet Take 0.6 mg by mouth Once a day   . esomeprazole magnesium (NEXIUM) 20 mg Oral Capsule, Delayed Release(E.C.) Take 20 mg by mouth Every morning before breakfast   . multivitamin Oral Tablet Take 1 Tab by mouth Once a day   . oxyCODONE-acetaminophen (PERCOCET) 5-325 mg Oral Tablet Take 1-2 Tabs by mouth Every 4 hours as needed for Pain   . sennosides-docusate sodium (SENOKOT-S) 8.6-50 mg Oral Tablet Take 1 Tab by mouth Every evening   . spironolactone (ALDACTONE) 25 mg Oral Tablet Take 25 mg by mouth Every morning with breakfast   . temazepam (RESTORIL) 15 mg Oral Capsule Take 15 mg by mouth Every night as needed for Insomnia          PHYSICAL EXAMINATION:  Vitals: Most Recent Vitals      Office Visit from 12/20/2018 in Hematology/Oncology Clinic, [facility]   Temperature  36.2 C (97.1 F) filed at... 12/20/2018 1242   Heart Rate  (!) 118 filed at... 12/20/2018 1242   Respiratory Rate  18 filed at... 12/20/2018 1242   BP (Non-Invasive)  106/62 filed at... 12/20/2018  1242   SpO2  --   Height  1.829 m (6' 0.01") filed at... 12/20/2018 1242   Weight  122.2 kg (269 lb 6.4 oz) filed at... 12/20/2018 1242   BMI (Calculated)  36.61 filed at... 12/20/2018 1242   BSA (Calculated)  2.49 filed at... 12/20/2018 1242      ECOG PS 1 - Restricted in physically strenuous activity, but ambulatory and able to carry out work of a light or sedentary nature, e.g., light housework, office work.  Prior to current illness, has been ECOG 2/3 recently (sitting down more than 1/2 day due to having a R ankle cast up until today)  General Vitals: Tachycardia on initial vitals but 80 on my manual assessment of the radial pulse  General: appears stated age and no distress, wife at side, morbidly obese  Eyes: Conjunctiva clear., Sclera non-icteric. , normal findings: corneas clear  HENT:  Mouth mucous membranes moist.   Neck: No JVD, no adenopathy and thyroid: not enlarged, symmetric, no tenderness/mass/nodules  Lungs: no cyanosis, breathing on ambient air, no accessory muscle use  Cardiovascular: Regular rate on my assessment, +S1/S2  Abdomen: soft, non-tender, obese habitus  Extremities: no cyanosis or edema  Skin: Skin warm and dry  Neurologic: grossly normal  Lymphatics: no lymphadenopathy  Psychiatric: normal affect, behavior, memory, thought content, judgement, and speech.  Access:  none    LABORATORY:  I have reviewed all lab results.  Outside labs reviewed and abnormal findings noted.  Lab Results Today:      Results for Leonard, Hart (MRN [id number]) as of 12/20/2018 18:24   Ref. Range 12/20/2018 09:30   IMMUNOGLOBULIN M Latest Ref Range: 35 - 242 mg/dL 39   IMMUNOGLOBULIN A Latest Ref Range: 85 - 499 mg/dL 262   IMMUNOGLOBULIN G Latest Ref Range: 610-1,616 mg/dL 1,570   BETA-2 MICROGLOBULIN (B2M), SERUM Unknown Rpt (A)   ELECTROPHORESIS, PROTEIN, SERUM Unknown Rpt (NSUS)   IMMUNOTYPING, SERUM Unknown Rpt (NSUS)   KAPPA AND LAMBDA FREE LIGHT CHAINS, SERUM Unknown Rpt (A)   KAP/LAM FREE LT CHAIN RATIO  Latest Ref Range: 0.26 - 1.65  6.43 (H)   KAPPA FREE LIGHT CHAINS Latest Ref Range: 0.33 - 1.94 mg/dL 6.49 (H)   LAMBDA FREE LIGHT CHAINS Latest Ref Range: 0.57 - 2.63 mg/dL 1.01     Outside labs and imaging    February 14, 2018: CT pelvis without contrast, impression there is an indeterminate 4.6 x 2.5x2.6 cm lytic lesion in the right ischium with a small associated acute pathological fracture. Degenerative changes in the spine.    MRI pelvis without contrast Mar 18, 2018 impression mass in the right ischium which was described on the previous CT has a solid appearance. Biopsy is recommended to exclude malignancy.    March 24, 2018 right hip bone lesion biopsy CT-guided. Impression flow cytometry shows 37.8% abnormal CD 56 positive plasma cell population identified as monoclonal kappa. Histologically sample is composed of markedly fragmented tissue composed of predominantly plasma cell with minimal background blood and fibrous tissues. No normal appearing bone marrow is identified. Findings are compatible with plasma cell neoplasm that is plasmacytoma.    April 04, 2018:   WBC 8.42, hemoglobin 12.4, hematocrit 18.1, platelet 329, absolute neutrophil count 5.39, creatinine 0.83, total protein 8.0, calcium 8.2, albumin 4.1, liver chemistries normal, LDH 118, beta-2 microglobulin 2.3, hematocrit 38.1, M spike 1.14, IgG 1910, IgA 265, IgM 33, Kappa free light chain 72.9, lambda free light chain 10.0, K/L Ratio 7.29 Serum immunofixation shows Two sets of atypical restricted bands are present in the specimen, one in the IgA and kappa lanes, and the other in the IgG and kappa lanes.  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   24-hour urine testing is pending.    Bone marrow biopsy April 04, 2018: Specimen #: [id number] PLASMA CELL NEOPLASM (5% PLASMA CELLS WITH KAPPA PREDOMINANCE), NORMOCELLULAR MARROW (40%) WITH TRILINEAGE HEMATOPOIESIS. - ADEQUATE MEGAKARYOCYTES. STAINABLE IRON PRESENT  WITHOUT RING SIDEROBLASTS. LYMPHOID AGGREGATE, REACTIVE. The recent diagnosis of a plasma cell neoplasm of the right ischium is noted. Immunostains for CD138 and kappa and lambda cytoplasmic immunoglobulin were performed on the core biopsy to better quantify plasma cells and evaluate for clonality, and showed that CD138+ plasma cells comprise an estimated 5% of overall cellularity and show a kappa over lambda predominance consistent with a kappa monoclonal population. Please correlate with  other clinical, laboratory and radiographic findings to further classify this process. A small lymphoid aggregate in the clot section was further evaluated with immunostains for CD3 and CD20, which showed that the aggregate was composed of a mixture of CD3+ T-cells and CD20+ B-cells as may be seen in a reactive process. PERIPHERAL BLOOD: CBC (04/04/2018): WBC 8.42 k/uL; Hgb 12.4 g/dL; MCV 83.7 fL; RDW 14.0%; Plts 329 k/uL. Differential (%): Neuts 59; Lymphs 38; Monos 0; Eos 3; Baso 0. (0-2) 5%   Plasma cells BONE MARROW BIOPSY: Adequacy: Adequate, despite aspiration artifact. Cellularity: Normal (40%). FISH 1q21 (CKS1B): Gain of CKS1B locus (76/100) +9 (CEP9): Trisomy of chromosome 9 (51/100) +15 (CEP15):  Trisomy of chromosome 15 (48/100) INTERPRETATION: These findings demonstrate a plasma cell population with gain of the CKS1B (1q21) loci as well as gains in chromosomes 9 and 15. In plasma cell myeloma, these findings are associated with high risk disease. Correlation with metaphase cytogenetic analysis is suggested.    IMPRESSION: MM FISH PANEL WITH IGH PANEL RESULT  COMPLEX KARYOTYPE  (?3 abnormalities)  HYPERDIPLOIDY (+5, +9, +15) [89%]  1q GAIN [82%]  MONOSOMY 13 [88%]  MYC SEPARATION (IGH/MYC FUSION EXCLUDED) [65%]  5'IGH LOSS/UNBALANCED REARRANGEMENT [90%]      May 04, 2018 PET scan: IMPRESSION: 1. Neck: Multiple foci of FDG avidity associated with right parotid gland,  could be infectious/inflammatory process.  Clinical  correlation and follow-up imaging suggested. 2.  Chest: No suspicious FDG avid process. 3.  Abdomen and pelvis: No evidence of FDG avid neoplastic process. 4.  Skeleton: FDG avid lesion in the right ischium/right acetabular region consistent with known plasmacytoma. No additional suspicious osseous lesions.    Follow up labs:  Sep 2019: M spike 1.02, IgG 1730, IgA 232, IgM 34, Kappa free light chain 70.5, lambda free light chain 10.8, K/L Ratio 6.53. Two sets of atypical restricted bands are present in the specimen, one in the IgA and kappa lanes, and the other in the IgG and kappa lanes.  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 He is establishing care with malignant hematology for close monitoring    1. Solitary Plasmacytoma in the setting of IgG and IgA Kappa restricted MGUS: high risk of progression with non IgG subtype, >'1500mg'$ /dl IgG, and abnormal FLC ratio. Biopsy-proven lytic lesion in the right ischium. IgA kappa and IgG kappa components. (M spike 1.14, IgG 1910.) 5% plasma cell with kappa predominance in the bone marrow. FISH 1q21 (CKS1B): Gain of CKS1B locus (76/100) +9 (CEP9): Trisomy of chromosome 9 (51/100) +15 (CEP15):  Trisomy of chromosome 15 (48/100): high risk disease.  Radiation therapy was given at [place] with total of 5000 cGy in 25 fx delivered and completed 06/14/2018. In the setting of recently treated R ankle cellulitis/joint infection, it is unclear what component of residual inflammation versus residual plasmacytoma disease is contributing to the elevated immunoglobulin level, kappa FLC. Nonetheless, his risk of recurrent local plasmacytoma, new solitary plasmacytoma, and development of Multiple Myeloma is high.   ([email or web address]-[email or web address]).     - labs today are notable for: IgG 1570, IgA 161, IgM 39, Kappa/Lambda: 6.42, Kappa FLC 6.49, Lambda FLC 1.01. SPEP pending. Beta-2 microglobulin: 2.47  - R sided pelvic pain has largely resolved    -  last PET/CT was 05/04/2018 and we are ordering a repeat in 6 months time for annual study to be completed (this is recommended for up to first 5 years per the NCCN guidelines)    - we will look to follow the patient in 3 months in the clinic with CBC c diff, RFP, serum quantitative immunoglobulins, SPEP, IFE, serum FLC, LDH, Beta-2 microglobulin    - strict instructions provided to call our office sooner if he develops any new pain anywhere, particularly in the bone      2.  R ankle arthritis: underlying tibiocalcaneal arthrodesis with progressive joint fusion and incorporation of femoral head allograft per today's x-ray. Treated 10/26-12/20/2019 for joint infection that has since resolved. The patient is following up with orthopedics         3.  Tobacco abuse: chews 1 can every 2 days. Pt counseled on the risks associated with this and advised to quit     4.  Normocytic anemia, mild: likely 2/2 recent acute illness and is recovering. Hgb 12.3 today with Hgb 10.8 on 11/28/2018.     5. Morbid obesity, noted.       The patient was seen and examined by me and discussed with the attending physician, Dr. Harrington Challenger, who agrees with the plan as outlined above    Devoria Albe, MD  12/20/2018, 13:04  Hematology/Oncology Fellow, PGY-5  Department of [place]         12/21/2018  I saw and examined the patient.  I reviewed the fellow's note.  I agree with the findings and plan of care as documented in the fellow's note.  Any exceptions/additions are edited/noted.  This was a 60 minute visit, more than half of which was spent in  counseling on plasmacytoma.      Katheren Puller, MD

## 2018-12-20 NOTE — Addendum Note (Signed)
Addended by: Inda Coke D on: 12/20/2018 12:01 PM     Modules accepted: Orders

## 2018-12-21 ENCOUNTER — Other Ambulatory Visit (HOSPITAL_BASED_OUTPATIENT_CLINIC_OR_DEPARTMENT_OTHER): Payer: Self-pay | Admitting: Internal Medicine

## 2018-12-21 ENCOUNTER — Encounter (HOSPITAL_BASED_OUTPATIENT_CLINIC_OR_DEPARTMENT_OTHER): Payer: Self-pay | Admitting: Internal Medicine

## 2018-12-21 DIAGNOSIS — C9030 Solitary plasmacytoma not having achieved remission: Secondary | ICD-10-CM

## 2018-12-21 LAB — IMMUNOSUBTRACTION, SERUM: PATHOLOGIST INTERPRETATION (IMMUNOTYPING): ABNORMAL — AB

## 2018-12-21 LAB — IMMUNOTYPING, SERUM: PATHOLOGIST INTERPRETATION (IMMUNOTYPING): ABNORMAL — AB

## 2018-12-21 LAB — PROTEIN ELECTROPHORESIS, SERUM (SPEP): PATHOLOGIST INTERPRETATION SPEP: ABNORMAL — AB

## 2018-12-21 NOTE — Progress Notes (Signed)
PATIENT NAMECOHL, [name]   HOSPITAL NUMBER:  [id number]  DATE OF SERVICE: 12/20/2018  DATE OF BIRTH:  [birth year]    PROGRESS NOTE    SUBJECTIVE:  [name] is a 53 year old male who returns to clinic for followup of a right ankle TTC fusion.  He was converted from a total ankle arthroplasty that became infected after dental work.  He is doing well.  He has no complaints.  He is not on any antibiotics.  He was previously in a cast returns to clinic today.    OBJECTIVE:  All of his wounds look great.  He is having no complaints with regard to pain.  No cellulitic changes.  No fevers, chills, nausea, vomiting, or diarrhea.    ASSESSMENT AND PLAN:  He was placed into his Cam boot and made weightbearing as tolerated.  We will see him back in 6 weeks with repeat standing x-rays of the right ankle.  He is agreeable with this plan of care.  All questions were answered.        Evans Lance, MD  Resident   [place] Department of Orthopaedics       Fulton Reek, MD  Associate Professor   [place] Department of [place]              DD:  12/20/2018 11:50:24  DT:  12/21/2018 08:44:16 NW  D#:  [id number]  I saw and examined the patient.  I directly supervised the resident's activities and procedures.  I reviewed the resident's note.  I agree with the findings and plan of care as documented in the resident's note.    Herbie Baltimore Kurtis Bushman, MD  Associate Professor  Chief of Foot & Ankle Surgery  Department of [place] [facility]  12/26/2018 7:03 AM

## 2018-12-23 ENCOUNTER — Other Ambulatory Visit (HOSPITAL_BASED_OUTPATIENT_CLINIC_OR_DEPARTMENT_OTHER): Payer: Self-pay | Admitting: Internal Medicine

## 2018-12-26 ENCOUNTER — Telehealth (INDEPENDENT_AMBULATORY_CARE_PROVIDER_SITE_OTHER): Payer: Self-pay

## 2018-12-26 NOTE — Telephone Encounter (Signed)
Message   Received: Yesterday   Message Contents   [name], Leonard Barry, MD  Taquita Demby, [place], RN            [facility] - it is called [employer]    Previous Messages      ----- Message -----   From: Koya Hunger, [place], RN   Sent: 12/22/2018  1:18 PM EDT   To: Fulton Reek, MD     What would this be and how does he get it?   ----- Message -----   From: Salvatore Decent   Sent: 12/22/2018 12:45 PM EST   To: Ortho Dr Irena Cords Service             Message from Salvatore Decent sent at 12/22/2018 12:45 PM EST     [name] PT    Pt called. States when he was in the office he had seen a paper in the exam room advertising some type of shoe lift that you strap something on the shoe. He would like to get one of those and wants to know how to go about it.   Please call him to discuss.     Thanks    Call History      Type Contact Phone User   12/22/2018 12:43 PM EST Phone (Incoming) Ostin, Mathey (Self) [phone] Lemmie Evens) Salvatore Decent     I called and informed the patient of the above. He verbalized understanding an also states that his foot is having pain when he walks and wants to know if this is to be expected. I advised that pain is to be expected especially the more he walks. I advised to continue to rest, ice, and elevate as much as possible. He verbalized understanding and had no further questions at this time.  [place] Annai Heick, RN  12/26/2018, 08:49

## 2018-12-28 ENCOUNTER — Encounter (HOSPITAL_BASED_OUTPATIENT_CLINIC_OR_DEPARTMENT_OTHER): Payer: Self-pay | Admitting: Internal Medicine

## 2019-01-16 ENCOUNTER — Telehealth (INDEPENDENT_AMBULATORY_CARE_PROVIDER_SITE_OTHER): Payer: Self-pay

## 2019-01-16 NOTE — Telephone Encounter (Signed)
medical// Return Call   Received: Today   Message Contents   [name], Dierdre Highman Ortho Dr Irena Cords Service         Message from [facility] sent at 01/16/2019 11:34 AM EDT     Summary: medical// Return Call     Dr Irena Cords pt       Pt is calling in stating he has Questions about the bone stimulator   Pt is requesting a return call from medical staff     Thank You!             Call History      Type Contact Phone User   01/16/2019 11:32 AM EDT Phone (Incoming) [name], [name] (Self) [phone] (H) [name], [name]     Dr.Santrock - patient states that he is having a hard time keeping the bone stimulator on his foot at night. He said that yesterday was the best it has felt. Last night he put the bone stimulator on his leg and he woke up this morning and can barley walk. He has pain shooting from his foot to his hip. It hurts when he puts it on both his foot and his leg but worse when its on his leg. He wants to know what he should do? He said he cant keep it on his foot at night. [place] Jemimah Cressy, RN  01/16/2019, 15:01    -------------------    I called and spoke to the patient about the above. Message left for Dr.Santrock  [place] Ellenore Roscoe, RN  01/16/2019, 15:01

## 2019-01-23 ENCOUNTER — Other Ambulatory Visit: Payer: Self-pay

## 2019-02-16 ENCOUNTER — Other Ambulatory Visit: Payer: Self-pay

## 2019-02-16 ENCOUNTER — Ambulatory Visit
Admission: RE | Admit: 2019-02-16 | Discharge: 2019-02-16 | Disposition: A | Discharge status: Home / Self Care | Payer: BC Managed Care – PPO | Source: Ambulatory Visit

## 2019-02-16 ENCOUNTER — Ambulatory Visit (HOSPITAL_BASED_OUTPATIENT_CLINIC_OR_DEPARTMENT_OTHER): Payer: BC Managed Care – PPO

## 2019-02-16 ENCOUNTER — Telehealth: Payer: BC Managed Care – PPO

## 2019-02-16 DIAGNOSIS — M19079 Primary osteoarthritis, unspecified ankle and foot: Secondary | ICD-10-CM | POA: Insufficient documentation

## 2019-02-16 DIAGNOSIS — Z981 Arthrodesis status: Secondary | ICD-10-CM

## 2019-02-16 DIAGNOSIS — M19071 Primary osteoarthritis, right ankle and foot: Secondary | ICD-10-CM

## 2019-02-16 DIAGNOSIS — M7989 Other specified soft tissue disorders: Secondary | ICD-10-CM

## 2019-02-16 DIAGNOSIS — Z96661 Presence of right artificial ankle joint: Secondary | ICD-10-CM | POA: Insufficient documentation

## 2019-02-16 NOTE — Progress Notes (Signed)
Dictated  Herbie Baltimore Kurtis Bushman, MD  Associate Professor  Chief of Foot & Ankle Surgery  Department of [place] [facility]  02/16/2019 11:50 AM

## 2019-02-16 NOTE — Telemedicine Progress Note (Signed)
PATIENT NAME: [place] NUMBER:  [id number]  DATE OF SERVICE: 02/16/2019  DATE OF BIRTH:  July 24, 1966    TELEMEDICINE PROGRESS NOTE    This will be titled foot and ankle orthopedics followup visit through My Chart scheduled return patient.    SUBJECTIVE:  Mr. Luczynski was scheduled for a video visit.  It was marked as the visit in the patient schedule.  It had a white dot next to the name, which is our marker for confirmed video visit, but the patient showed up the [place].  I was not at the [facility] when this occurred.  The patient agreed to go with the video visit at the [facility] while I was at home because I was not able to get away from my home in time to keep him there reasonably.  Again, I confirmed with the schedule.  He was marked as a video visit.  He was marked with a white dot next to his name, which means confirmed.  The patient, when asked, says that he thinks that he himself may have forgotten that it was scheduled as a video visit.  Nonetheless, we apologized.      Verbal consent given to do the management of the patient this way as we were set up doing video visits from home and I would have taken a long time for me to shower and get to the office to see him in person.  He did not want to wait, so we sent him for his x-rays and did our video visit this way.     He says he has had no pain since the first week of surgery.  He continues to advance his activities and standing longer and walking around longer periods of time without incident.  He is neurovascularly grossly intact without any kind of deficit except for numbness and tingling at the forefoot, which has been present but slowly getting better.    OBJECTIVE:  Exam shows by video that he has very minimal amount of swelling, a well-aligned foot and all the incisions well healed.  He has no tenderness when he palpates and stands with a normal position.    RADIOGRAPH  ASSESSMENT:  Tibia-fibula weightbearing x-rays as well as ankle weightbearing x-rays of the right lower extremity show that the alignments are good.  The hardware is in good place without loosening and the incorporation of the fusion site appears to be doing well.    IMPRESSION AND PLAN:  Tibiotalar calcaneal intramedullary rod fusion on the right, doing well.  He is about 4 months out but doing terrifically and happy with the progress.  I Will see him back in 3 months.  We will do a weightbearing CT scan of the right ankle on return.  This has been ordered and will be precertified   beforehand.  Otherwise, we do want ankle weightbearing x-rays upon return as well and I have no restrictions and he can come out of his boot at this time.        Fulton Reek, MD  Associate Professor   [facility] Department of Orthpaedics              DD:  02/16/2019 12:01:54  DT:  02/16/2019 15:17:39 DG  D#:  [id number]

## 2019-03-03 ENCOUNTER — Other Ambulatory Visit (HOSPITAL_BASED_OUTPATIENT_CLINIC_OR_DEPARTMENT_OTHER): Payer: Self-pay | Admitting: Internal Medicine

## 2019-03-09 NOTE — Progress Notes (Signed)
MULTIDISCIPLINARY MLS TUMOR BOARD DISCUSSION:    DATE: 03/09/2019  PATIENT: [name]  MRN:  [id number]  DOB: 01/26/1966  AGE: 53 y.o.    REFERRING PROVIDER: No ref. 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   RADIATION: Pt received 5000 cGy in 25 fx in July through August 2019.    CLINICAL TRIAL PARTICIPATION: No  ELIGIBLE CLINICAL TRIALS: No    NEED FOR PALLIATIVE CARE?:  No  PSYCHOSOCIAL CONCERNS?:   No  REHABILITATION CONCERNS?:   No  NUTRITIONAL CONCERNS?: No  DENTAL/ORAL SURGERY?: No    PATIENT NAVIGATOR DISCUSSED OUTREACH ACTIVITIES/PROGRAMS?: No  PATIENT NAVIGATOR PROVIDED EDUCATIONAL MATERIAL TO PATIENT?: No    THE PATIENT'S MOST RECENT CLINICAL INFORMATION, IMAGING, AND PATHOLOGY WAS DISCUSSED TODAY AT [facility] MULTIDISCIPLINARY MLS TUMOR BOARD BY THE MEDICAL ONCOLOGY, RADIATION ONCOLOGY, SURGERY, RADIOLOGY, PATHOLOGY, SOCIAL SERVICES, GENETIC, REHAB, AND NURSING SERVICES.  RECOMMENDATIONS BASED ON TODAY'S TUMOR BOARD:   -F/u in 3 months    York Pellant 03/09/2019 13:36  Oncology Tumor Board Coordinator      Katheren Puller, MD  05/23/2019, 16:30

## 2019-03-21 ENCOUNTER — Encounter (HOSPITAL_BASED_OUTPATIENT_CLINIC_OR_DEPARTMENT_OTHER): Payer: Self-pay | Admitting: Internal Medicine

## 2019-03-21 ENCOUNTER — Ambulatory Visit (HOSPITAL_BASED_OUTPATIENT_CLINIC_OR_DEPARTMENT_OTHER): Payer: BC Managed Care – PPO | Admitting: Internal Medicine

## 2019-03-21 ENCOUNTER — Ambulatory Visit
Admission: RE | Admit: 2019-03-21 | Discharge: 2019-03-21 | Disposition: A | Discharge status: Home / Self Care | Payer: BC Managed Care – PPO | Source: Ambulatory Visit | Attending: Internal Medicine | Admitting: Internal Medicine

## 2019-03-21 ENCOUNTER — Other Ambulatory Visit: Payer: Self-pay

## 2019-03-21 VITALS — BP 126/68 | HR 66 | Temp 97.5°F | Resp 18 | Ht 72.01 in | Wt 270.3 lb

## 2019-03-21 DIAGNOSIS — M19071 Primary osteoarthritis, right ankle and foot: Secondary | ICD-10-CM | POA: Insufficient documentation

## 2019-03-21 DIAGNOSIS — C9030 Solitary plasmacytoma not having achieved remission: Secondary | ICD-10-CM | POA: Insufficient documentation

## 2019-03-21 DIAGNOSIS — D649 Anemia, unspecified: Secondary | ICD-10-CM | POA: Insufficient documentation

## 2019-03-21 DIAGNOSIS — Z720 Tobacco use: Secondary | ICD-10-CM | POA: Insufficient documentation

## 2019-03-21 LAB — PLATELETS AND ANC CANCER CENTER
NEUTROPHILS #: 3.37 10*3/uL (ref 1.50–7.70)
PLATELET COUNT: 269 10*3/uL (ref 150–400)

## 2019-03-21 LAB — CBC WITH DIFF
BASOPHIL #: <0.1 10*3/uL (ref <=0.20)
BASOPHIL %: 1 %
EOSINOPHIL #: 0.22 10*3/uL (ref <=0.50)
EOSINOPHIL %: 4 %
HCT: 36.7 % — ABNORMAL LOW (ref 38.9–52.0)
HGB: 11.7 g/dL — ABNORMAL LOW (ref 13.4–17.5)
IMMATURE GRANULOCYTE #: <0.1 10*3/uL (ref <0.10)
IMMATURE GRANULOCYTE %: 1 % (ref 0–1)
LYMPHOCYTE #: 1.56 10*3/uL (ref 1.00–4.80)
LYMPHOCYTE %: 27 %
MCH: 26.4 pg (ref 26.0–32.0)
MCHC: 31.9 g/dL (ref 31.0–35.5)
MCV: 82.7 fL (ref 78.0–100.0)
MONOCYTE #: 0.49 10*3/uL (ref 0.20–1.10)
MONOCYTE %: 9 %
MPV: 10.5 fL (ref 8.7–12.5)
NEUTROPHIL #: 3.37 10*3/uL (ref 1.50–7.70)
NEUTROPHIL %: 58 %
PLATELETS: 269 10*3/uL (ref 150–400)
RBC: 4.44 10*6/uL — ABNORMAL LOW (ref 4.50–6.10)
RDW-CV: 14.8 % (ref 11.5–15.5)
WBC: 5.7 10*3/uL (ref 3.7–11.0)
WBC: 5.7 10*3/uL (ref 3.7–11.0)

## 2019-03-21 LAB — BILIRUBIN TOTAL: BILIRUBIN TOTAL: 0.5 mg/dL (ref 0.3–1.3)

## 2019-03-21 LAB — BETA-2 MICROGLOBULIN (B2M), SERUM: BETA-2 MICROGLOBULIN, SERUM: 2.4 ug/mL — ABNORMAL HIGH (ref 0.80–2.34)

## 2019-03-21 LAB — URIC ACID: URIC ACID: 8.2 mg/dL — ABNORMAL HIGH (ref 3.5–7.5)

## 2019-03-21 LAB — KAPPA AND LAMBDA FREE LIGHT CHAINS, SERUM
KAPPA FREE LIGHT CHAINS: 6.8 mg/dL — ABNORMAL HIGH (ref 0.33–1.94)
KAPPA/LAMBDA FLC RATIO: 7.56 — ABNORMAL HIGH (ref 0.26–1.65)
LAMBDA FREE LIGHT CHAINS: 0.9 mg/dL (ref 0.57–2.63)

## 2019-03-21 LAB — BUN: BUN: 14 mg/dL (ref 8–25)

## 2019-03-21 LAB — SODIUM: SODIUM: 139 mmol/L (ref 136–145)

## 2019-03-21 LAB — ALK PHOS (ALKALINE PHOSPHATASE): ALKALINE PHOSPHATASE: 125 U/L — ABNORMAL HIGH (ref 45–115)

## 2019-03-21 LAB — CREATININE WITH EGFR
CREATININE: 0.94 mg/dL (ref 0.62–1.27)
ESTIMATED GFR: >60 mL/min/{1.73_m2} (ref >60)

## 2019-03-21 LAB — PROTEIN FOR ELECTROPHORESIS: PROTEIN TOTAL: 7.1 g/dL (ref 6.0–7.9)

## 2019-03-21 LAB — POTASSIUM: POTASSIUM: 3.9 mmol/L (ref 3.5–5.1)

## 2019-03-21 LAB — PHOSPHORUS: PHOSPHORUS: 3.5 mg/dL (ref 2.4–4.7)

## 2019-03-21 LAB — IMMUNOGLOBULIN G (IGG), SERUM: IMMUNOGLOBULIN G (IGG): 1547 mg/dL (ref 610–1616)

## 2019-03-21 LAB — IMMUNOGLOBULIN M (IGM), SERUM: IMMUNOGLOBULIN M (IGM): 43 mg/dL (ref 35–242)

## 2019-03-21 LAB — ALBUMIN FOR ELECTROPHORESIS: ALBUMIN: 3.6 g/dL (ref 3.5–5.0)

## 2019-03-21 LAB — CARBON DIOXIDE (CO2, BICARBONATE): CO2 TOTAL: 24 mmol/L (ref 22–32)

## 2019-03-21 LAB — LDH: LDH: 189 U/L (ref 125–220)

## 2019-03-21 LAB — ALBUMIN: ALBUMIN: 3.6 g/dL (ref 3.5–5.0)

## 2019-03-21 LAB — CHLORIDE: CHLORIDE: 107 mmol/L (ref 96–111)

## 2019-03-21 LAB — IMMUNOGLOBULIN A (IGA), SERUM: IMMUNOGLOBULIN A (IGA): 205 mg/dL (ref 85–499)

## 2019-03-21 LAB — AST (SGOT): AST (SGOT): 42 U/L (ref 8–48)

## 2019-03-21 LAB — TOTAL PROTEIN: PROTEIN TOTAL: 7.6 g/dL (ref 6.4–8.3)

## 2019-03-21 LAB — CALCIUM: CALCIUM: 9 mg/dL (ref 8.5–10.2)

## 2019-03-21 LAB — ALT (SGPT): ALT (SGPT): 77 U/L — ABNORMAL HIGH (ref <55)

## 2019-03-22 ENCOUNTER — Encounter (HOSPITAL_BASED_OUTPATIENT_CLINIC_OR_DEPARTMENT_OTHER): Payer: Self-pay | Admitting: Internal Medicine

## 2019-03-22 LAB — PROTEIN ELECTROPHORESIS, SERUM (SPEP): PATHOLOGIST INTERPRETATION SPEP: ABNORMAL — AB

## 2019-03-22 LAB — IMMUNOTYPING, SERUM: PATHOLOGIST INTERPRETATION (IMMUNOTYPING): ABNORMAL — AB

## 2019-03-22 NOTE — Cancer Center Note (Signed)
[place]       CANCER CENTER NOTE     Date: 03/22/2019  Name: [name]  MRN: [id number]  Primary Care Provider: Brandon Melnick, MD    REASON FOR VISIT: 53 y.o.male for evaluation and management of high risk MGUS and solitary plasmacytoma of the R ischium     HISTORY OF PRESENT ILLNESS:     [name] is 53 y.o. male who is here for follow up. The patient is accompanied by family who provided some of the history.     - 10/2017: patient had a worsening aching back pain and R hip pain that he went to the chiropractor for treatment x3  - 02/14/2018: slip and near fall in his shop:  CT pelv w/o c showing 4.6x2.5x2.6 cm lytic lesion in the R ischium  - 03/18/2018: MRI pelvis w/o c: R ischium from previous CT has a solid appearance. Recommend biopsy  -03/24/2018: R ischial bone biopsy with flow cytometry showing 37.8% abnormal CD 56+ plasma cell. Findings compatible with plasma cell neoplasm that is plasmacytoma  -04/04/2018: B2 microglobulin 2.3, M spike 1.14, IGG 1910 mg/dl, IgA 265 mg/dl, IgM 33 mg/dl, Kappa FLC: 72.9, Lambda FLC: 10.0, K/L: 7.29. IFE: 2 restricted bands in IgA and kappa lanes and IgG and kappa lanes. BMB showing 5% plasma cells with kappa predominance. Normocellular marrow (40%). FISH notable for 1q21, +9, +15 indicating high risk disease  - 05/04/2018: PET/CT: multiple foci of FDG avidity involving R parotid gland. FDG avid lesion in the R ischium/R acetabular region   - 05/11/2018-06/14/2018: 5000 cGy in 25 fx  - 06/2018: M spike 1.02, IGG 1730 mg/dl, IgA 232 mg/dl, IgM 34 mg/dl, Kappa FLC: 70.5, Lambda FLC: 10.8, K/L: 6.53.  - 08/13/2018-08/23/2018: R ankle pain with diagnosis of R ankle cellulitis/joint infection. IV Abx completed 10/07/18  - 12/20/2018: R ankle cast removed and off non-weight bearing. Establishes care with [facility] malignant hematology clinic    He states that he has been having pain in his R pelvis since last January. In April, the work-up  began shortly after he slipped on water in his shop and nearly fell with resulting worsening pain in his R hip. He went to the [facility] hospital with x-ray performed and subsequent symptomatic pain control. He was not content with this and proceeded to [facility] where he had a CT pelvis study which revealed a 4.6 x 2.5 x 2.6cm lytic lesion. The rest of his w/u as described above.    Since he was last seen, he has been doing well.  Has his walking boot off.  No fevers, chills, night sweats, weight loss, new bone pain, urine changes.    REVIEW OF SYSTEMS:  General: (+) 1/10 ankle pain. (-) fevers (-) chills. (+) weight loss: 290 to 269lbs (-) fatigue.  Lymphatic: (-) palpable masses. (-) night sweats.  Heme: (-) easy bruising (-) bleeding.  (-) recurrent infections.   HEENT. (-) vision (-) hearing changes. (-) dysphagia. (-) sore throat.   Heart: (-) chest pain. (-) palpitation. (-) orthopnea. (-) LE edema.   Lungs: (-) dyspnea (on exertion) (-) hemoptysis. (-) cough.   Abdomen: (-) poor appetite. (-) abdominal pain. (-) nausea (-) vomiting. (-) diarrhea. (-) constipation.   GU: (-) dysuria (-) Urgency. (-) Hematuria.   MS. (-) joint pain (-) ext swelling. (-) Back pain.    Dermatologic: (-) rashes. (-) pruritus.   Psychiatric: (-)  Depression. (-) anxiety. (-) insomnia.   Neurologic: (-) headaches. (-) neuropathy. (-) weakness. (-) memory problems.  Other review of systems negative.     PAST MEDICAL HISTORY:  Past Medical History:   Diagnosis Date   . Arthropathy    . Cancer (CMS [place])    . CPAP (continuous positive airway pressure) dependence     setting 13   . Difficult intubation    . Esophageal reflux    . Essential hypertension    . Hyperlipidemia    . Plasma cell tumor (CMS HCC)    . PONV (postoperative nausea and vomiting)    . Rash     rle, told he had psorasis, seen derm, given cream   . Sleep apnea    Morbid obesity      PAST SURGICAL HISTORY:  Past Surgical History:   Procedure Laterality  Date   . HX KNEE SURGERY      right   . HX OTHER      right ankle x3   . HX TONSILLECTOMY     R ankle prosthetic with multiple surgeries (most recently 10/2014),       SOCIAL HISTORY:  Social History     Socioeconomic History   . Marital status: Married     Spouse name: Not on file   . Number of children: Not on file   . Years of education: Not on file   . Highest education level: Not on file   Occupational History   . Not on file   Social Needs   . Financial resource strain: Not on file   . Food insecurity     Worry: Not on file     Inability: Not on file   . Transportation needs     Medical: Not on file     Non-medical: Not on file   Tobacco Use   . Smoking status: Never Smoker   . Smokeless tobacco: Current User     Types: Snuff   . Tobacco comment: has cut back on snuff use   Substance and Sexual Activity   . Alcohol use: Yes     Alcohol/week: 8.0 standard drinks     Types: 8 Cans of beer per week   . Drug use: No   . Sexual activity: Not on file   Lifestyle   . Physical activity     Days per week: Not on file     Minutes per session: Not on file   . Stress: Not on file   Relationships   . Social Product manager on phone: Not on file     Gets together: Not on file     Attends religious service: Not on file     Active member of club or organization: Not on file     Attends meetings of clubs or organizations: Not on file     Relationship status: Not on file   . Intimate partner violence     Fear of current or ex partner: Not on file     Emotionally abused: Not on file     Physically abused: Not on file     Forced sexual activity: Not on file   Other Topics Concern   . Ability to Walk 1 Flight of Steps without SOB/CP Yes   . Routine Exercise No   . Ability to Walk 2 Flight of Steps without SOB/CP Yes   . Unable to Ambulate No   . Total  Care No   . Ability To Do Own ADL's Yes     Comment: limited due to difficulty walking   . Uses Walker No   . Other Activity Level Yes   . Uses Cane No   Social History  Narrative   . Not on file   Denies nicotine abuse. Endorses 1 can chewing tobacco every 2 days (40 years). 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       FAMILY HISTORY:  Family Medical History:     Problem Relation (Age of Onset)    Hypertension (High Blood Pressure) Father      pUncle: cancer (unsure which type)  mUncle: lung cancer    ALLERGIES:  No Known Allergies     MEDICATIONS:  Current Outpatient Medications   Medication Sig   . AMLODIPINE BES/OLMESARTAN MED (AZOR ORAL) Take by mouth Once a day AM   . atorvastatin (LIPITOR) 10 mg Oral Tablet Take 10 mg by mouth Once a day   . colchicine 0.6 mg Oral Tablet Take 0.6 mg by mouth Once a day   . multivitamin Oral Tablet Take 1 Tab by mouth Once a day   . omeprazole magnesium (PRILOSEC ORAL) Take 80 mg by mouth   . spironolactone (ALDACTONE) 25 mg Oral Tablet Take 25 mg by mouth Every morning with breakfast   . temazepam (RESTORIL) 15 mg Oral Capsule Take 15 mg by mouth Every night as needed for Insomnia          PHYSICAL EXAMINATION:  Most Recent Vitals      Office Visit from 03/21/2019 in Hematology/Oncology Clinic, [facility]   Temperature  36.4 C (97.5 F) filed at... 03/21/2019 1410   Heart Rate  66 filed at... 03/21/2019 1410   Respiratory Rate  18 filed at... 03/21/2019 1410   BP (Non-Invasive)  126/68 filed at... 03/21/2019 1410   SpO2  -   Height  1.829 m (6' 0.01") filed at... 03/21/2019 1410   Weight  123 kg (270 lb 4.5 oz) filed at... 03/21/2019 1410   BMI (Calculated)  36.72 filed at... 03/21/2019 1410   BSA (Calculated)  2.5 filed at... 03/21/2019 1410   ECOG PS 1 - Restricted in physically strenuous activity, but ambulatory and able to carry out work of a light or sedentary nature, e.g., light housework, office work.  Prior to current illness, has been ECOG 2/3 recently (sitting down more than 1/2 day due to having a R ankle cast up until today)  General Vitals: Tachycardia on initial vitals but 80 on my manual  assessment of the radial pulse  General: appears stated age and no distress, wife at side, morbidly obese  Eyes: Conjunctiva clear., Sclera non-icteric. , normal findings: corneas clear  HENT: Mouth mucous membranes moist.   Neck: No JVD, no adenopathy and thyroid: not enlarged, symmetric, no tenderness/mass/nodules  Lungs: no cyanosis, breathing on ambient air, no accessory muscle use  Cardiovascular: Regular rate on my assessment, +S1/S2  Abdomen: soft, non-tender, obese habitus  Extremities: no cyanosis or edema  Skin: Skin warm and dry  Neurologic: grossly normal  Lymphatics: no lymphadenopathy  Psychiatric: normal affect, behavior, memory, thought content, judgement, and speech.  Access:  none    LABORATORY:  I have reviewed all lab results.    Lab Results Today:      Last CBC  (Last result in the past 2 years)      WBC   HGB  HCT   MCV   Platelets      03/21/19 1150 5.7 11.7 36.7 82.7 269        Last BMP  (Last result in the past 2 years)      Na   K   Cl   CO2   BUN   Cr   Calcium   Glucose   Glucose-Fasting        03/21/19 1150 139                     03/21/19 1150   3.9                   03/21/19 1150           0.94           03/21/19 1150     107                 03/21/19 1150       24               03/21/19 1150             9.0         03/21/19 1150         14               Last Hepatic Panel  (Last result in the past 2 years)      Albumin   Total PTN   Total Bili   Direct Bili   Ast/SGOT   Alt/SGPT   Alk Phos        03/21/19 1150   7.6               03/21/19 1150     0.5  Comment:  Naproxen treatment can falsely elevate total bilirubin levels.             03/21/19 1150         42         03/21/19 1150           77       03/21/19 1150             125     03/21/19 1150 3.6                 03/21/19 1150   7.1               03/21/19 1150 3.6                         Outside labs and imaging    February 14, 2018: CT pelvis without contrast, impression there is an indeterminate 4.6 x 2.5x2.6 cm lytic lesion in the  right ischium with a small associated acute pathological fracture. Degenerative changes in the spine.    MRI pelvis without contrast Mar 18, 2018 impression mass in the right ischium which was described on the previous CT has a solid appearance. Biopsy is recommended to exclude malignancy.    March 24, 2018 right hip bone lesion biopsy CT-guided. Impression flow cytometry shows 37.8% abnormal CD 56 positive plasma cell population identified as monoclonal kappa. Histologically sample is composed of markedly fragmented tissue composed of predominantly plasma cell with minimal background blood and fibrous tissues. No normal appearing bone marrow is identified. Findings are compatible with plasma cell neoplasm that is plasmacytoma.    April 04, 2018:  WBC 8.42, hemoglobin 12.4, hematocrit 18.1, platelet 329, absolute neutrophil count 5.39, creatinine 0.83, total protein 8.0, calcium 8.2, albumin 4.1, liver chemistries normal, LDH 118, beta-2 microglobulin 2.3, hematocrit 38.1, M spike 1.14, IgG 1910, IgA 265, IgM 33, Kappa free light chain 72.9, lambda free light chain 10.0, K/L Ratio 7.29 Serum immunofixation shows Two sets of atypical restricted bands are present in the specimen, one in the IgA and kappa lanes, and the other in the IgG and kappa lanes.  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   24-hour urine testing is pending.    Bone marrow biopsy April 04, 2018: Specimen #: [id number] PLASMA CELL NEOPLASM (5% PLASMA CELLS WITH KAPPA PREDOMINANCE), NORMOCELLULAR MARROW (40%) WITH TRILINEAGE HEMATOPOIESIS. - ADEQUATE MEGAKARYOCYTES. STAINABLE IRON PRESENT WITHOUT RING SIDEROBLASTS. LYMPHOID AGGREGATE, REACTIVE. The recent diagnosis of a plasma cell neoplasm of the right ischium is noted. Immunostains for CD138 and kappa and lambda cytoplasmic immunoglobulin were performed on the core biopsy to better quantify plasma cells and evaluate for clonality, and showed that CD138+ plasma  cells comprise an estimated 5% of overall cellularity and show a kappa over lambda predominance consistent with a kappa monoclonal population. Please correlate with other clinical, laboratory and radiographic findings to further classify this process. A small lymphoid aggregate in the clot section was further evaluated with immunostains for CD3 and CD20, which showed that the aggregate was composed of a mixture of CD3+ T-cells and CD20+ B-cells as may be seen in a reactive process. PERIPHERAL BLOOD: CBC (04/04/2018): WBC 8.42 k/uL; Hgb 12.4 g/dL; MCV 83.7 fL; RDW 14.0%; Plts 329 k/uL. Differential (%): Neuts 59; Lymphs 38; Monos 0; Eos 3; Baso 0. (0-2) 5%   Plasma cells BONE MARROW BIOPSY: Adequacy: Adequate, despite aspiration artifact. Cellularity: Normal (40%). FISH 1q21 (CKS1B): Gain of CKS1B locus (76/100) +9 (CEP9): Trisomy of chromosome 9 (51/100) +15 (CEP15):  Trisomy of chromosome 15 (48/100) INTERPRETATION: These findings demonstrate a plasma cell population with gain of the CKS1B (1q21) loci as well as gains in chromosomes 9 and 15. In plasma cell myeloma, these findings are associated with high risk disease. Correlation with metaphase cytogenetic analysis is suggested.    IMPRESSION: MM FISH PANEL WITH IGH PANEL RESULT  COMPLEX KARYOTYPE  (?3 abnormalities)  HYPERDIPLOIDY (+5, +9, +15) [89%]  1q GAIN [82%]  MONOSOMY 13 [88%]  MYC SEPARATION (IGH/MYC FUSION EXCLUDED) [65%]  5'IGH LOSS/UNBALANCED REARRANGEMENT [90%]      May 04, 2018 PET scan: IMPRESSION: 1. Neck: Multiple foci of FDG avidity associated with right parotid gland,  could be infectious/inflammatory process.  Clinical correlation and follow-up imaging suggested. 2.  Chest: No suspicious FDG avid process. 3.  Abdomen and pelvis: No evidence of FDG avid neoplastic process. 4.  Skeleton: FDG avid lesion in the right ischium/right acetabular region consistent with known plasmacytoma. No additional suspicious osseous lesions.    Follow up  labs:  Sep 2019: M spike 1.02, IgG 1730, IgA 232, IgM 34, Kappa free light chain 70.5, lambda free light chain 10.8, K/L Ratio 6.53. Two sets of atypical restricted bands are present in the specimen, one in the IgA and kappa lanes, and the other in the IgG and kappa lanes.  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        ASSESSMENT/PLAN:        This is a 44M c PMHx notable for morbid obesity, OSA, HTN, who was found to have a R  ischium solitary plasmacytoma and underlying high risk MGUS. He is establishing care with malignant hematology for close monitoring    1. Solitary Plasmacytoma in the setting of IgG and IgA Kappa restricted MGUS: high risk of progression with non IgG subtype, >'1500mg'$ /dl IgG, and abnormal FLC ratio. Biopsy-proven lytic lesion in the right ischium. IgA kappa and IgG kappa components. (M spike 1.14, IgG 1910.) 5% plasma cell with kappa predominance in the bone marrow. FISH 1q21 (CKS1B): Gain of CKS1B locus (76/100) +9 (CEP9): Trisomy of chromosome 9 (51/100) +15 (CEP15):  Trisomy of chromosome 15 (48/100): high risk disease.  Radiation therapy was given at [place] with total of 5000 cGy in 25 fx delivered and completed 06/14/2018. In the setting of recently treated R ankle cellulitis/joint infection, it is unclear what component of residual inflammation versus residual plasmacytoma disease is contributing to the elevated immunoglobulin level, kappa FLC. Nonetheless, his risk of recurrent local plasmacytoma, new solitary plasmacytoma, and development of Multiple Myeloma is high.  ([email or web address]).     - labs today are notable for: IgG 1547, Kappa/Lambda: 7.56.  SPEP pending.  - last PET/CT was 05/04/2018, repeat in 3 months time for annual study    2.  R ankle arthritis: underlying tibiocalcaneal  arthrodesis with progressive joint fusion and incorporation of femoral head allograft per today's x-ray. Treated 10/26-12/20/2019 for joint infection that has since resolved. The patient is following up with orthopedics.  Walking boot off.    3.  Normocytic anemia, mild:  Hgb 11.7 today, monitor.     4. Morbid obesity, noted.     RTC 3 months with HM2 and PET/CT prior.    This was a 20 minute visit, more than half of which was spent in counseling on plasmacytoma and MGUS.      Katheren Puller, MD

## 2019-05-17 ENCOUNTER — Other Ambulatory Visit (INDEPENDENT_AMBULATORY_CARE_PROVIDER_SITE_OTHER): Payer: Self-pay

## 2019-05-17 DIAGNOSIS — M19079 Primary osteoarthritis, unspecified ankle and foot: Secondary | ICD-10-CM

## 2019-05-18 ENCOUNTER — Other Ambulatory Visit: Payer: Self-pay

## 2019-05-18 ENCOUNTER — Ambulatory Visit
Admission: RE | Admit: 2019-05-18 | Discharge: 2019-05-18 | Disposition: A | Discharge status: Home / Self Care | Payer: BC Managed Care – PPO | Source: Ambulatory Visit | Attending: Family Medicine | Admitting: Family Medicine

## 2019-05-18 ENCOUNTER — Ambulatory Visit (HOSPITAL_BASED_OUTPATIENT_CLINIC_OR_DEPARTMENT_OTHER): Payer: BC Managed Care – PPO

## 2019-05-18 ENCOUNTER — Encounter (INDEPENDENT_AMBULATORY_CARE_PROVIDER_SITE_OTHER): Payer: Self-pay

## 2019-05-18 ENCOUNTER — Ambulatory Visit (HOSPITAL_BASED_OUTPATIENT_CLINIC_OR_DEPARTMENT_OTHER): Payer: BC Managed Care – PPO | Admitting: Radiology

## 2019-05-18 DIAGNOSIS — Z981 Arthrodesis status: Secondary | ICD-10-CM

## 2019-05-18 DIAGNOSIS — M19079 Primary osteoarthritis, unspecified ankle and foot: Secondary | ICD-10-CM

## 2019-05-18 DIAGNOSIS — M19071 Primary osteoarthritis, right ankle and foot: Secondary | ICD-10-CM

## 2019-05-18 DIAGNOSIS — Z9889 Other specified postprocedural states: Secondary | ICD-10-CM

## 2019-05-18 DIAGNOSIS — Z96661 Presence of right artificial ankle joint: Secondary | ICD-10-CM

## 2019-05-18 DIAGNOSIS — Z09 Encounter for follow-up examination after completed treatment for conditions other than malignant neoplasm: Secondary | ICD-10-CM | POA: Insufficient documentation

## 2019-05-22 NOTE — Progress Notes (Signed)
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 male presenting to clinic today for follow-up of above mention procedure.  [name] was last evaluated by telemedicine on February 16, 2019, at which time he reported no pain and continued to advance his activities.  At that time, we had no restrictions and were very happy with his progress.     Presently, [name] reports no pain, he tells me he is doing well and has no complaints regarding his ankle at today's visit.  [name] currently rates his pain 0/10.      OBJECTIVE:       Focused musculoskeletal exam of the right lower extremity:    Deformity:  No deformity.     Skin:  Normal temperature, color, and tone.    Swelling:  No edema.    Tenderness:  No tenderness to palpation.    Gait:  Normal.     IMAGING:   1. XR Right ankle WEIGHT BEARING, dated 05/18/2019, personally reviewed and interpreted by myself:   Early bridging noted and fusing as expected. Hardware remains in good position.     ASSESSMENT:  1. S/p right TTC IM rod fusion    PLAN:   Discussed with patient we would like to see him back in 6 months for a right lower extremity CT scan and x-rays. He was advised if he has any pain or problems to contact us for a sooner follow up otherwise plan to see him back as previously mentioned.     We will follow-up with him in clinic in 6 months; or sooner, as needed.  At that time, obtain XR Right ankle and right tib/fib WEIGHT BEARING upon arrival as well as CT lower extremity.      All of  his questions were answered, and he appeared satisfied with the plan of treatment.  Should he have any further questions or concerns, he was advised to contact our clinic.          [place], PA-C  Foot & Ankle Surgery  Department of [facility]  05/22/2019, 10:17 AM    The patient was seen independently, and in the absence of, the co-signing physician.

## 2019-06-22 ENCOUNTER — Ambulatory Visit (HOSPITAL_BASED_OUTPATIENT_CLINIC_OR_DEPARTMENT_OTHER): Payer: BC Managed Care – PPO

## 2019-06-22 ENCOUNTER — Other Ambulatory Visit (INDEPENDENT_AMBULATORY_CARE_PROVIDER_SITE_OTHER): Payer: Self-pay

## 2019-06-22 ENCOUNTER — Ambulatory Visit (HOSPITAL_BASED_OUTPATIENT_CLINIC_OR_DEPARTMENT_OTHER): Payer: BC Managed Care – PPO | Admitting: Internal Medicine

## 2019-06-22 ENCOUNTER — Encounter (HOSPITAL_BASED_OUTPATIENT_CLINIC_OR_DEPARTMENT_OTHER): Payer: Self-pay

## 2019-06-30 ENCOUNTER — Other Ambulatory Visit: Payer: Self-pay

## 2019-10-26 ENCOUNTER — Other Ambulatory Visit (HOSPITAL_COMMUNITY): Payer: Self-pay

## 2019-10-26 LAB — EXTERNAL COVID-19 MOLECULAR RESULT: External 2019-n-CoV/SARS-CoV-2: POSITIVE — AB

## 2019-11-16 ENCOUNTER — Encounter (INDEPENDENT_AMBULATORY_CARE_PROVIDER_SITE_OTHER): Payer: Self-pay

## 2019-11-16 ENCOUNTER — Other Ambulatory Visit (INDEPENDENT_AMBULATORY_CARE_PROVIDER_SITE_OTHER): Payer: Self-pay

## 2019-12-02 IMAGING — MR MRI CERVICAL SPINE WITHOUT CONTRAST
4 of 5 series · 23 of 48 positions shown · IV contrast (gadolinium)
Comparison: Radiographs from outside facility dated 11/23/2019.

EXAM:  MRI CERVICAL SPINE WITHOUT CONTRAST
INDICATION: Neck pain with right upper extremity radiculopathy. History of multiple myeloma.
TECHNIQUE: Multiplanar multisequential MRI of the cervical spine was performed without gadolinium contrast.

[Series 8: STIR · sagittal · 3.0mm · 0.47mm/px · 3 of 13 slices shown]
[im 2/13]
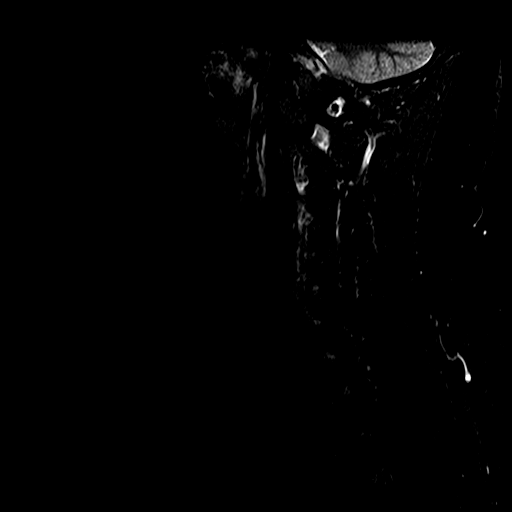
[im 7/13]
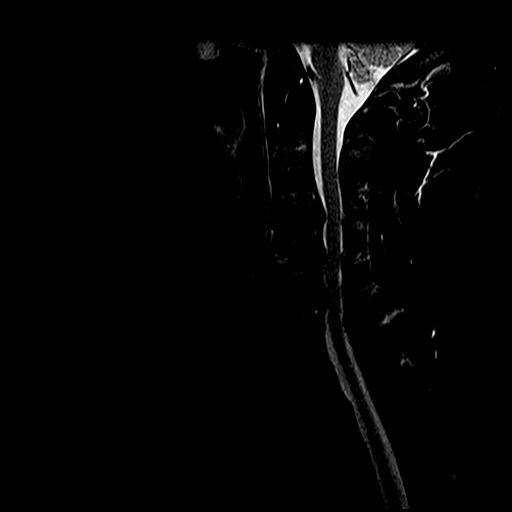
[im 11/13]
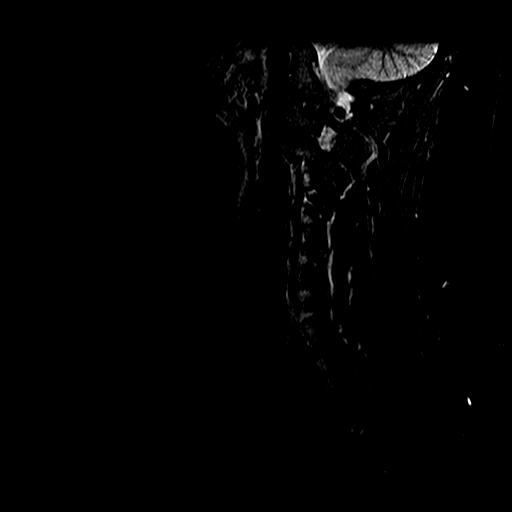

[Series 9: T2 · sagittal · 3.0mm · 0.75mm/px · 8 of 13 slices shown (1 of 2)]
[im 1/13]
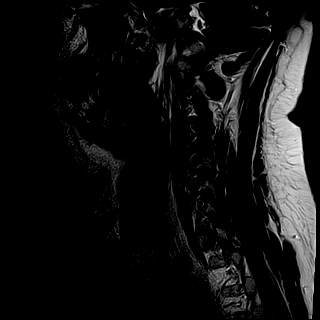
[im 2/13]
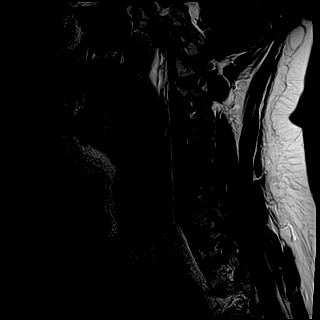
[im 4/13]
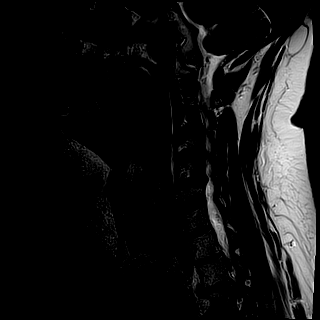
[im 6/13]
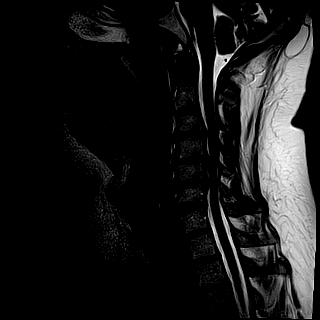
[im 7/13]
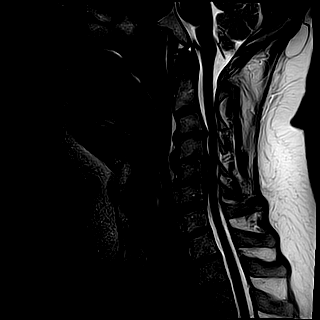
[im 9/13]
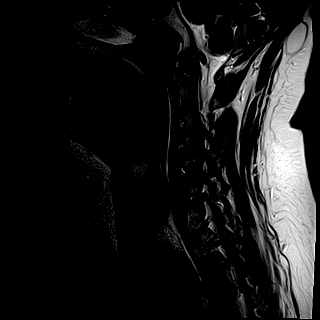
[im 11/13]
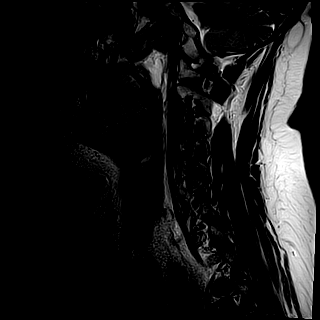
[im 13/13]
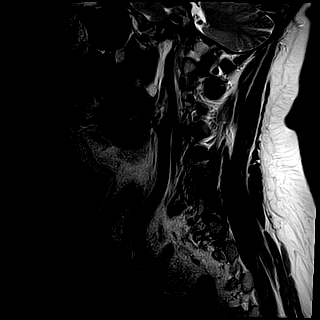

[Series 10: T1 · sagittal · 3.0mm · 0.47mm/px · 3 of 13 slices shown]
[im 2/13]
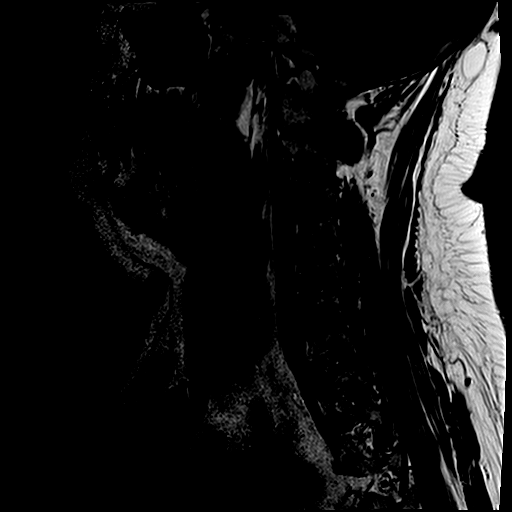
[im 7/13]
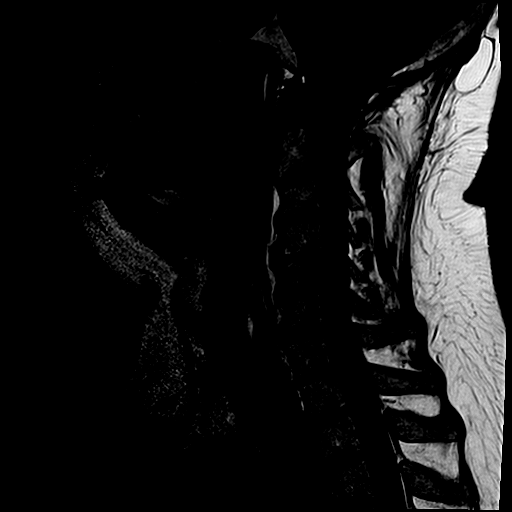
[im 11/13]
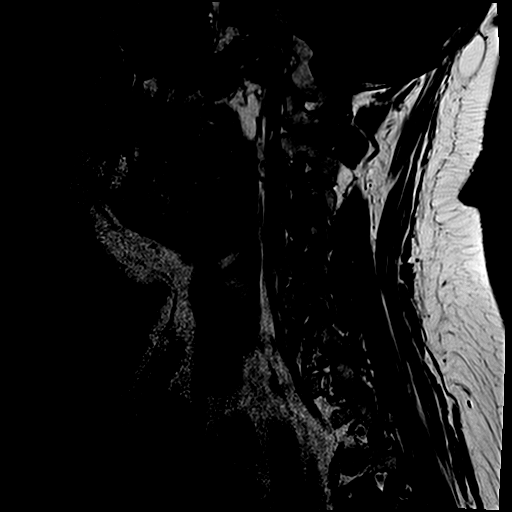

[Series 13: T2 · axial · 3.0mm · 0.31mm/px · z∈[-67,+36]mm · 9 of 18 slices shown (2 of 2)]
[im 1/18]
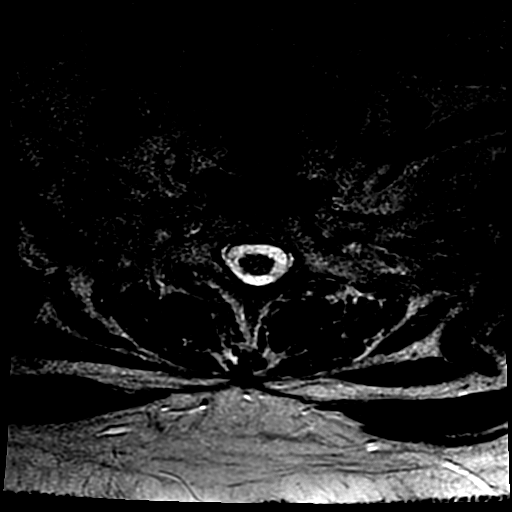
[im 4/18]
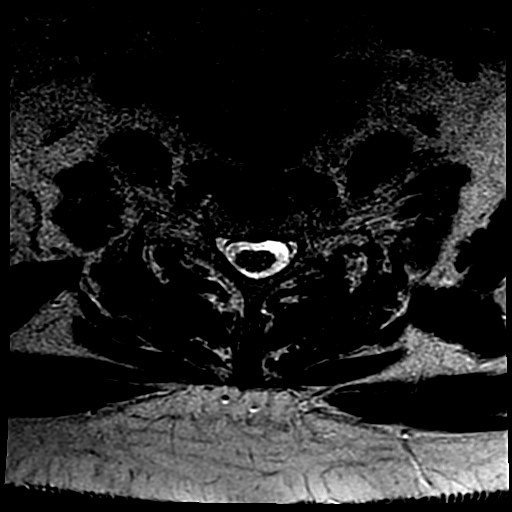
[im 5/18]
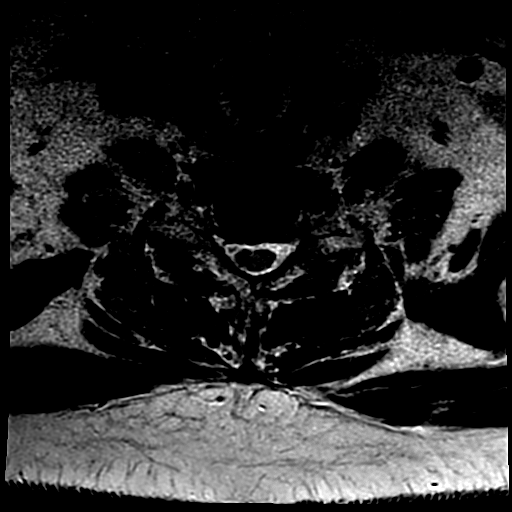
[im 8/18]
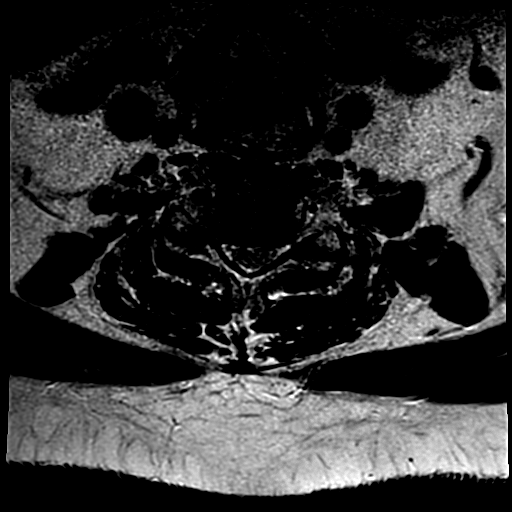
[im 10/18]
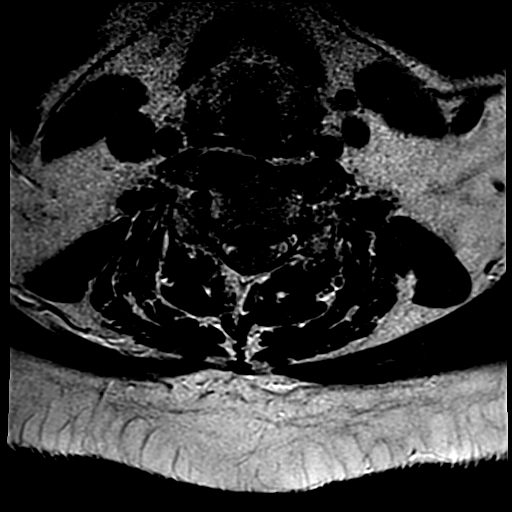
[im 13/18]
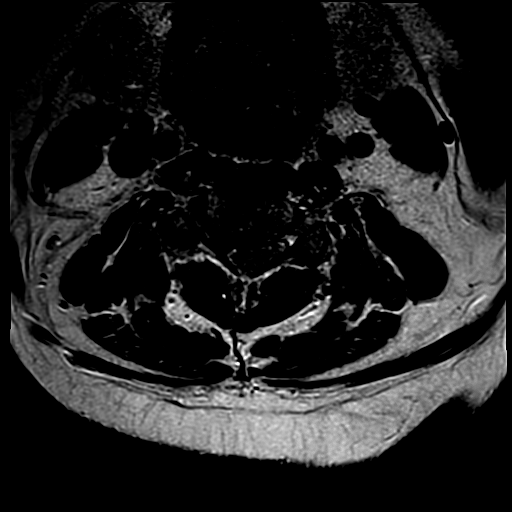
[im 14/18]
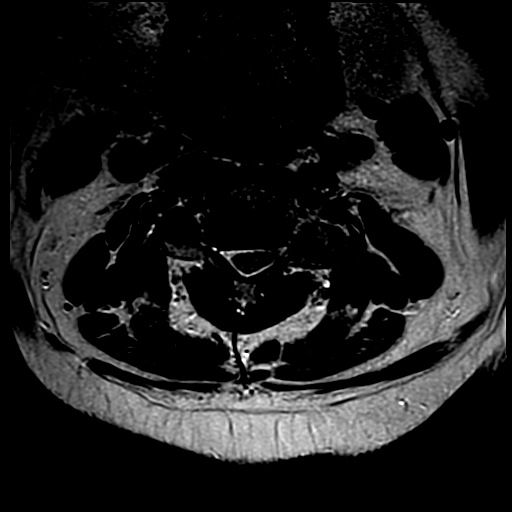
[im 16/18]
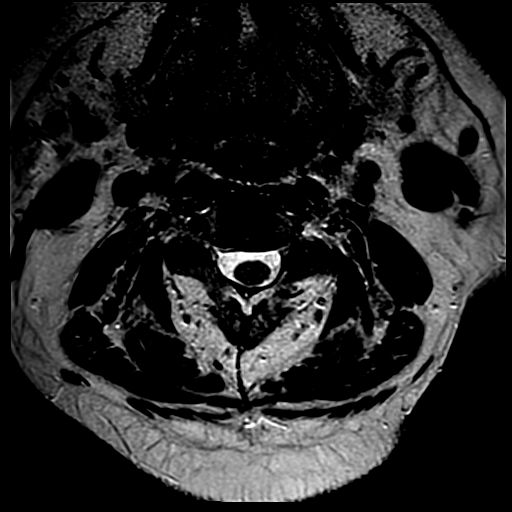
[im 18/18]
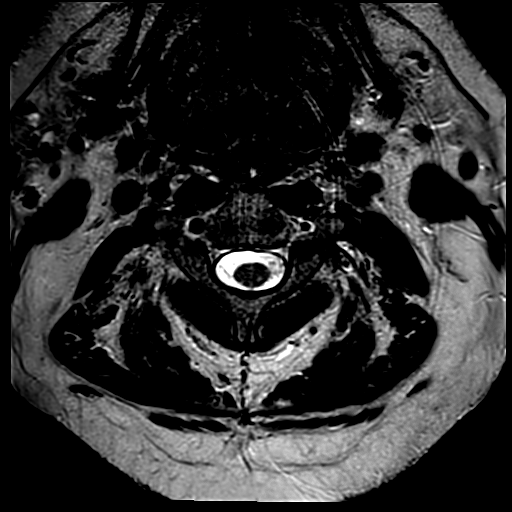

[23 of 48 positions shown; findings below may reference images not displayed]

FINDINGS: Mild reversal of cervical lordosis is likely positional. There is no acute fracture or subluxation. Bone marrow signal intensity is normal.

At C2-3 level, there is moderate to severe left neural foraminal stenosis from facet and uncovertebral joint hypertrophy.

At C3-4 level, there is moderate to marked disc desiccation. There is a small broad-based central disc osteophyte complex resulting in mild spinal stenosis. There is severe bilateral neural foraminal stenosis from facet and uncovertebral joint hypertrophy.

At C4-5 level, there is a small broad-based central disc osteophyte complex resulting in mild-to-moderate spinal stenosis. There is severe bilateral neural foraminal stenosis from facet and uncovertebral joint hypertrophy.

At C5-6 level, there is a small broad-based central disc osteophyte complex resulting in mild-to-moderate spinal stenosis. There is severe bilateral neural foraminal stenosis from facet and uncovertebral joint hypertrophy.

At C6-7 level, there is a small broad-based central disc bulge, mildly effacing the ventral CSF. There is severe right and moderate left neural foraminal stenosis from facet and uncovertebral joint hypertrophy.

C7-T1 level and paraspinal soft tissues are unremarkable.
IMPRESSION: Mild spinal stenosis at C3-4 level and mild-to-moderate spinal stenosis at C4-5 and C5-6 levels from small central disc osteophyte complexes. No spinal cord signal abnormality seen at any level to suggest cord edema or myelomalacia. Close clinical follow-up and neurosurgical consult are recommended. 

Multilevel neural foraminal stenosis as detailed above.

## 2020-11-11 ENCOUNTER — Other Ambulatory Visit: Payer: Self-pay

## 2020-11-11 ENCOUNTER — Other Ambulatory Visit (HOSPITAL_COMMUNITY): Payer: Self-pay

## 2020-11-11 LAB — EXTERNAL COVID-19 MOLECULAR RESULT: External 2019-n-CoV/SARS-CoV-2: POSITIVE — AB

## 2022-05-25 IMAGING — MR MRI SHOULDER RT W/O CONTRAST
6 of 8 series · 30 of 40 positions shown · non-contrast
Comparison: None available.

﻿EXAM:  [id number]   MRI SHOULDER RT W/O CONTRAST
INDICATION: Chronic right shoulder pain.
TECHNIQUE: Noncontrast multiplanar, multisequence MRI was performed.

[Series 6: T1 · oblique · 4.0mm · 0.31mm/px · 5 of 22 slices shown]
[im 1/22]
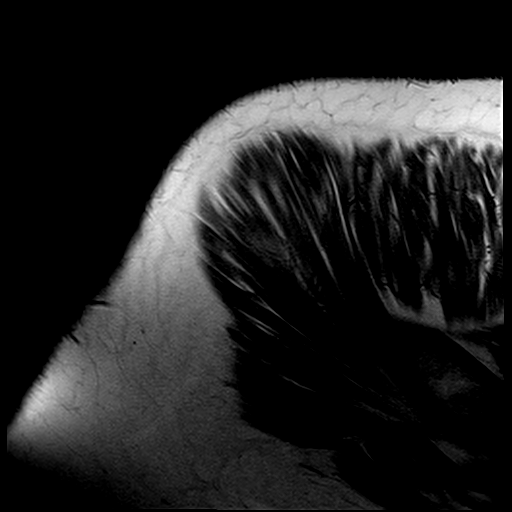
[im 6/22]
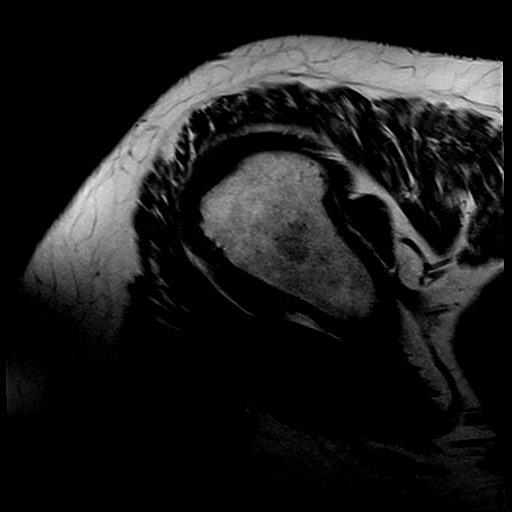
[im 11/22]
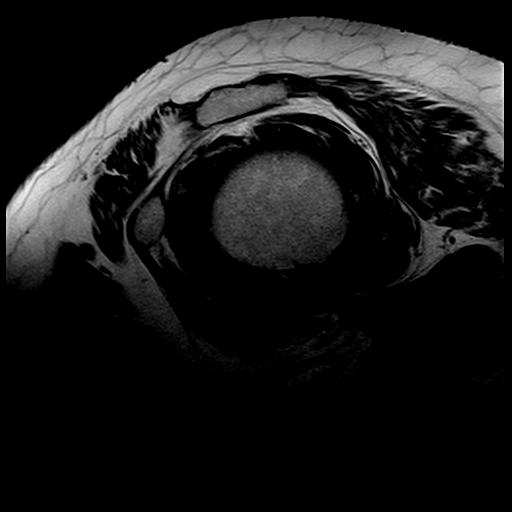
[im 16/22]
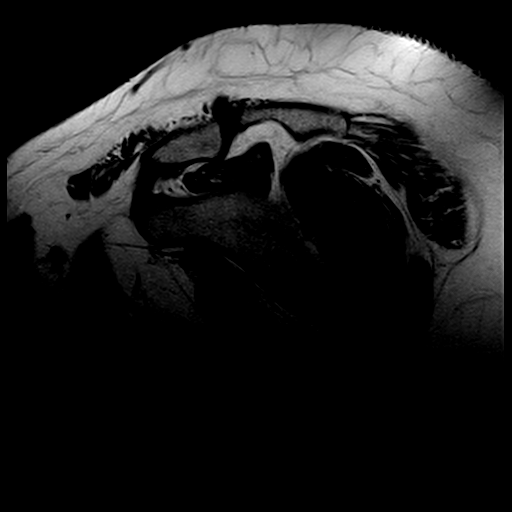
[im 22/22]
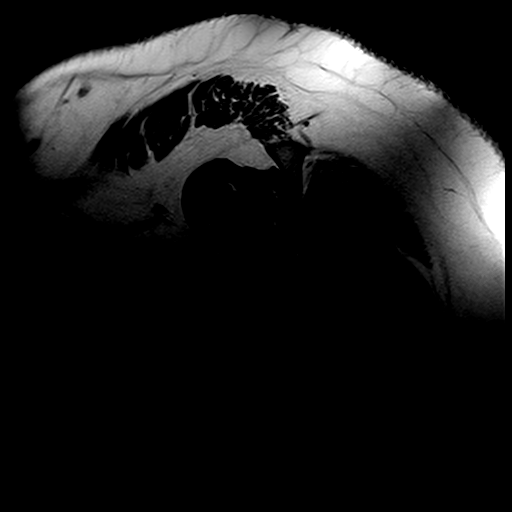

[Series 7: T2 · oblique · 4.0mm · 0.42mm/px · 5 of 22 slices shown]
[im 1/22]
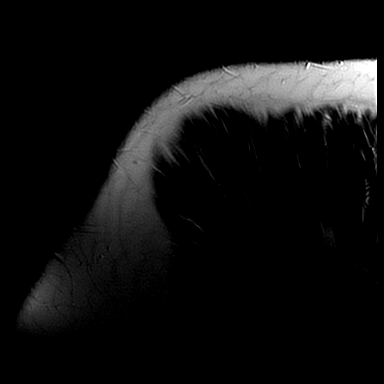
[im 6/22]
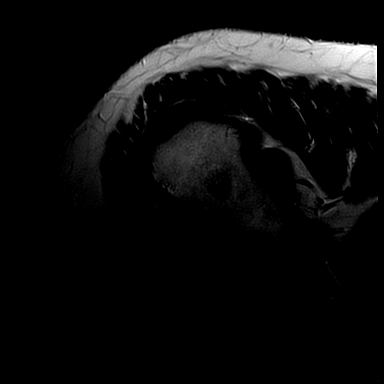
[im 11/22]
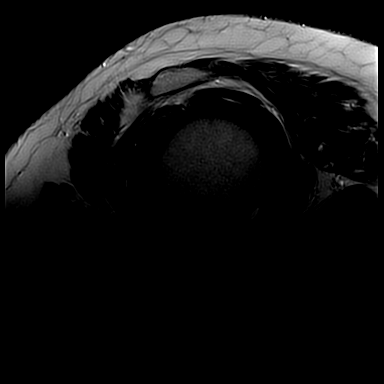
[im 16/22]
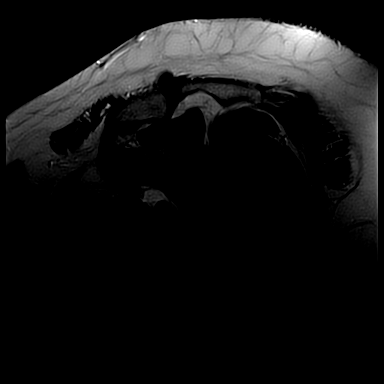
[im 22/22]
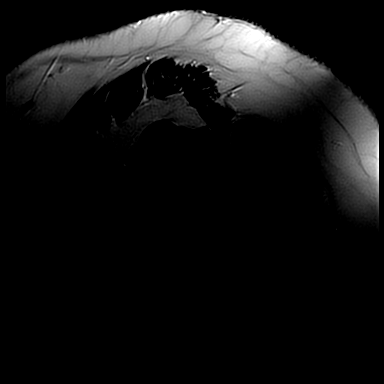

[Series 8: STIR · oblique · 4.0mm · 0.36mm/px · 2 of 22 slices shown]
[im 1/22]
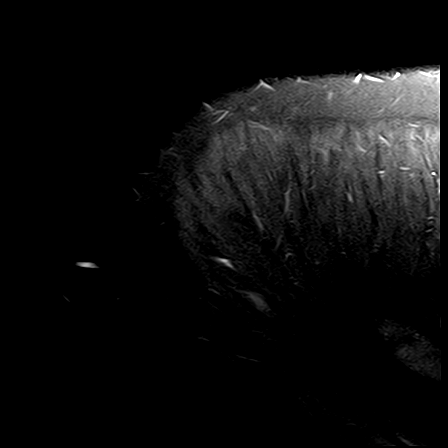
[im 6/22]
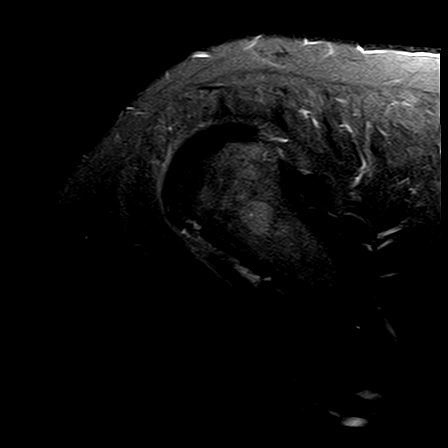

[Series 9: PD fat-sat · axial · 4.0mm · 0.36mm/px · z∈[-30,+75]mm · 6 of 24 slices shown (1 of 2)]
[im 1/24]
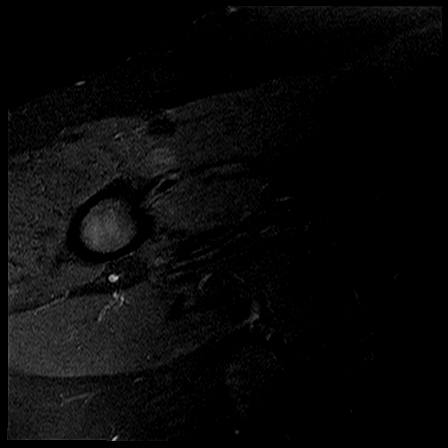
[im 5/24]
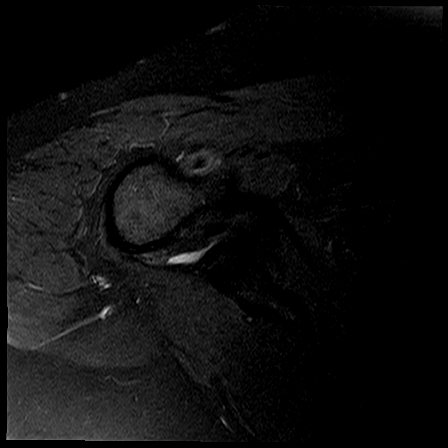
[im 10/24]
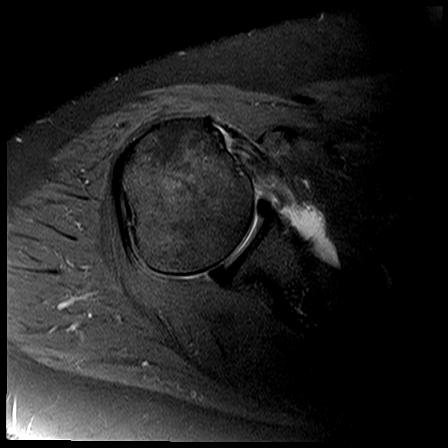
[im 14/24]
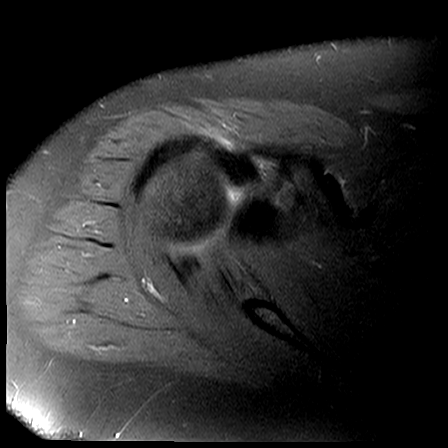
[im 19/24]
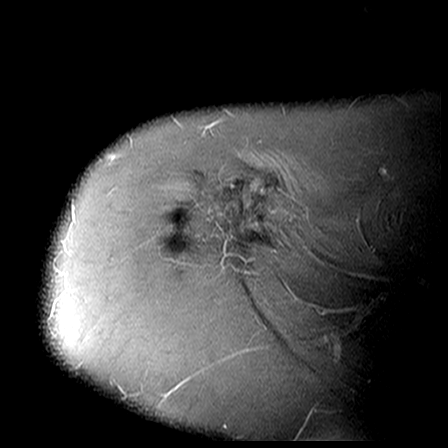
[im 24/24]
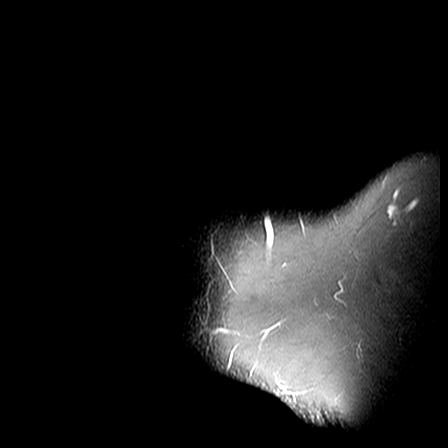

[Series 10: T2 fat-sat · axial · 4.0mm · 0.42mm/px · z∈[-30,+75]mm · 6 of 24 slices shown]
[im 1/24]
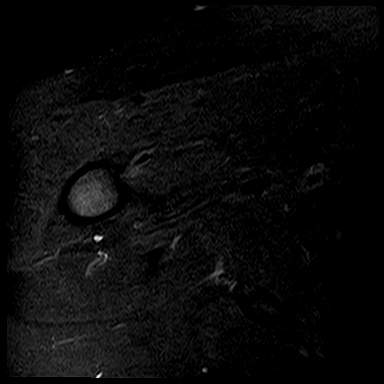
[im 5/24]
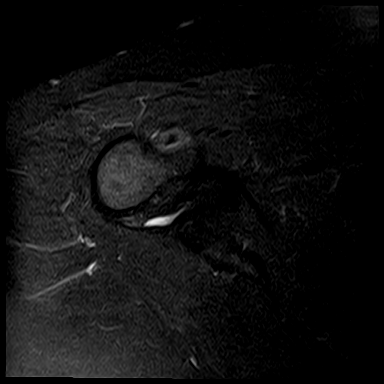
[im 10/24]
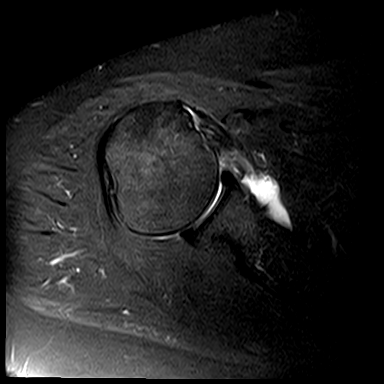
[im 14/24]
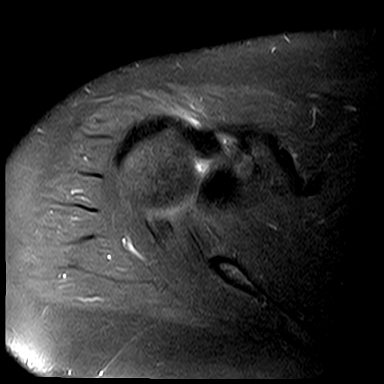
[im 19/24]
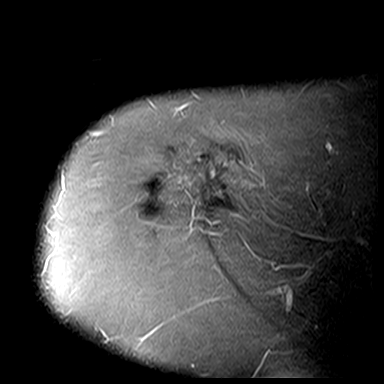
[im 24/24]
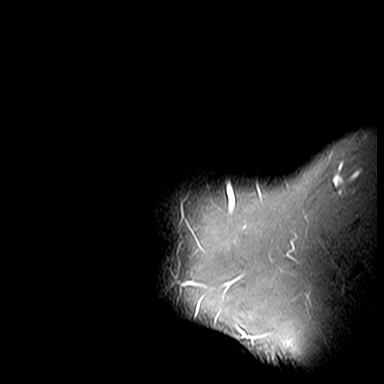

[Series 11: PD fat-sat · oblique · 4.0mm · 0.53mm/px · 6 of 23 slices shown (2 of 2)]
[im 1/23]
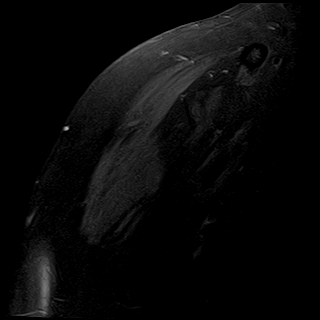
[im 5/23]
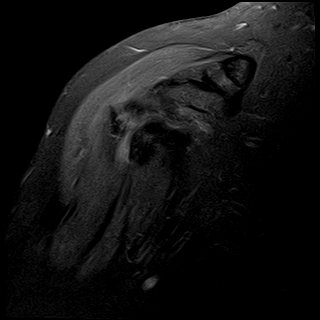
[im 9/23]
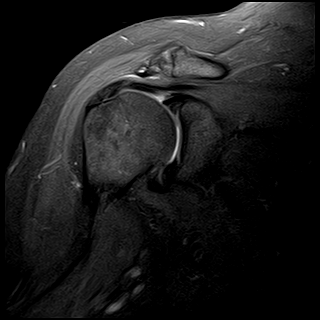
[im 14/23]
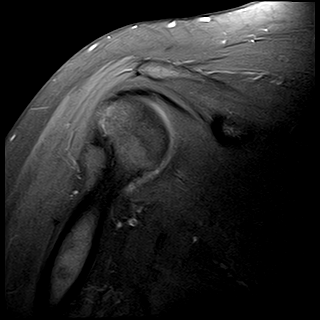
[im 18/23]
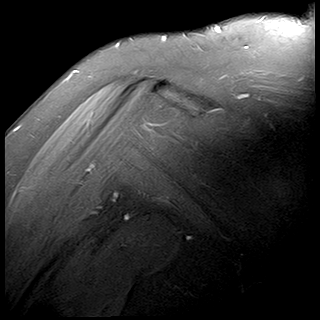
[im 23/23]
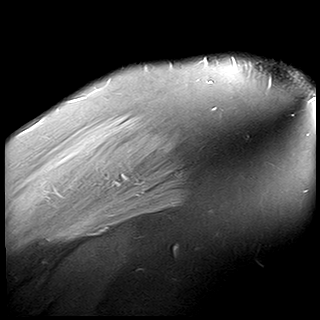

[30 of 40 positions shown; findings below may reference images not displayed]

FINDINGS: There is intermediate signal intensity seen within the supraspinatus tendon compatible with tendinosis.  No definite rotator cuff tear is seen.  The remaining visualized tendons appear intact.  

There is no significant glenohumeral joint effusion.  There is trace fluid in the subacromial-subdeltoid bursa.  In the absence of a definite full thickness rotator cuff tear, this may reflect mild bursitis.  

There are mild degenerative changes involving the glenohumeral joint.  There is slight impingement on the supraspinatus musculotendinous junction by the acromion.  

The glenoid labrum appears intact.  There is no fracture, dislocation, bone contusion, or significant marrow signal alteration.
IMPRESSION: 1. Supraspinatus tendinosis.  

2. No definite rotator cuff tear is seen. 

3. Possible mild subacromial-subdeltoid bursitis.

## 2022-08-13 IMAGING — MR MRI CERVICAL SPINE WITHOUT CONTRAST
4 of 5 series · 24 of 48 positions shown · non-contrast
Comparison: None available.

﻿EXAM:  [id number]   MRI CERVICAL SPINE WITHOUT CONTRAST
INDICATION: Cervicalgia, radiculopathy, bilateral shoulder pain, right arm numbness.
TECHNIQUE: Noncontrast multiplanar, multisequence MRI was performed.

[Series 5: T2 · sagittal · 3.0mm · 0.75mm/px · 8 of 13 slices shown (1 of 2)]
[im 1/13]
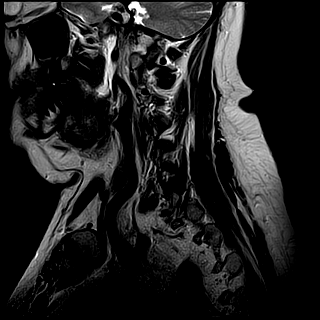
[im 2/13]
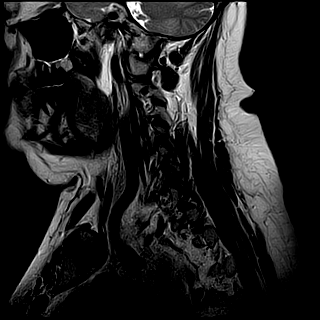
[im 4/13]
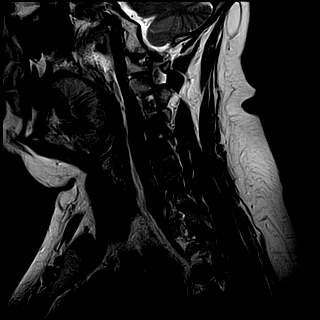
[im 6/13]
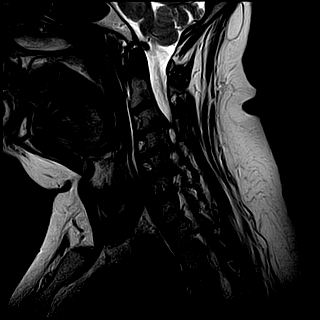
[im 7/13]
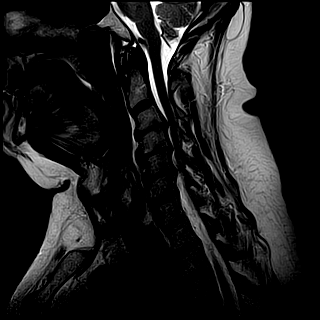
[im 9/13]
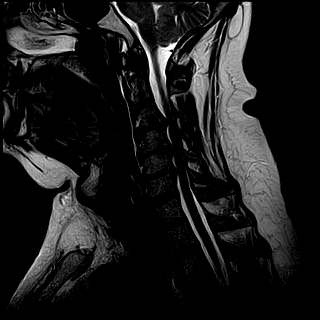
[im 11/13]
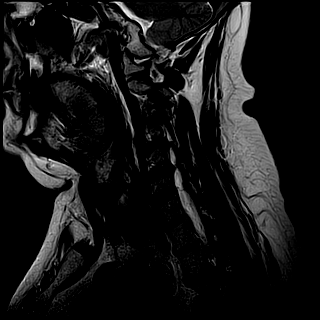
[im 13/13]
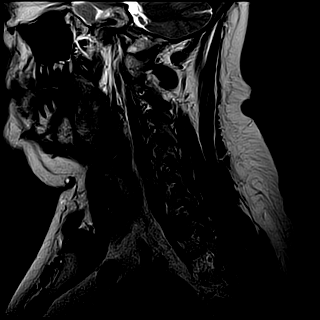

[Series 6: T1 · sagittal · 3.0mm · 0.47mm/px · 4 of 13 slices shown]
[im 1/13]
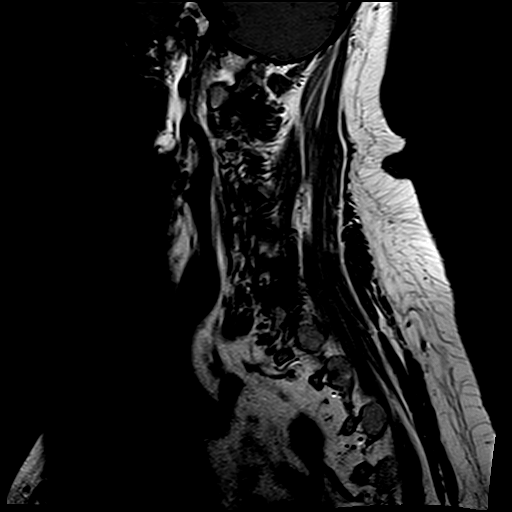
[im 2/13]
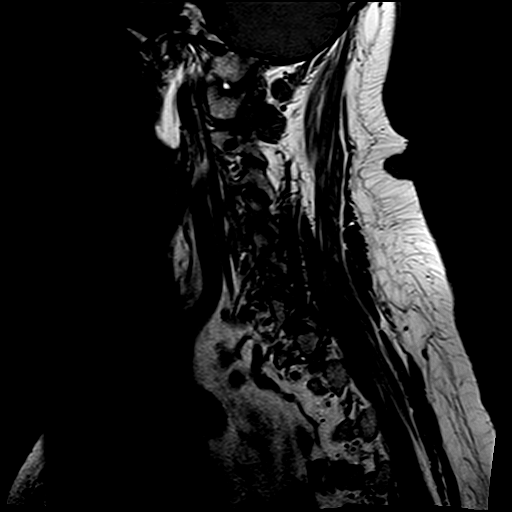
[im 7/13]
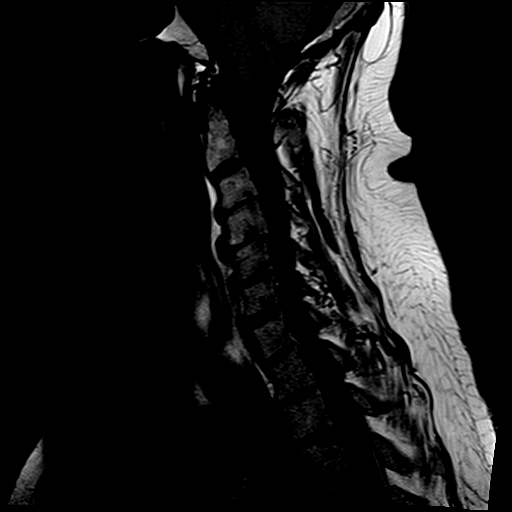
[im 11/13]
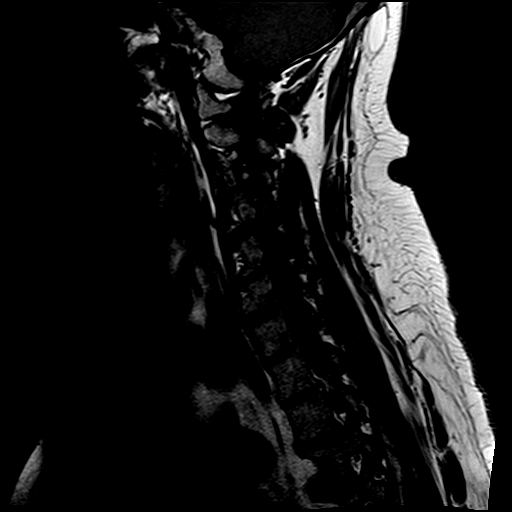

[Series 7: STIR · sagittal · 3.0mm · 0.47mm/px · 3 of 13 slices shown]
[im 2/13]
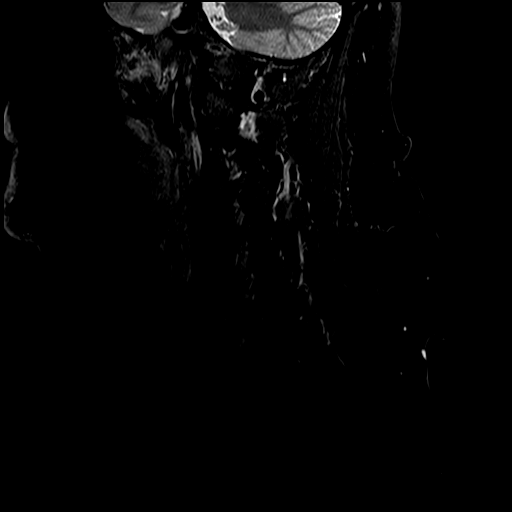
[im 7/13]
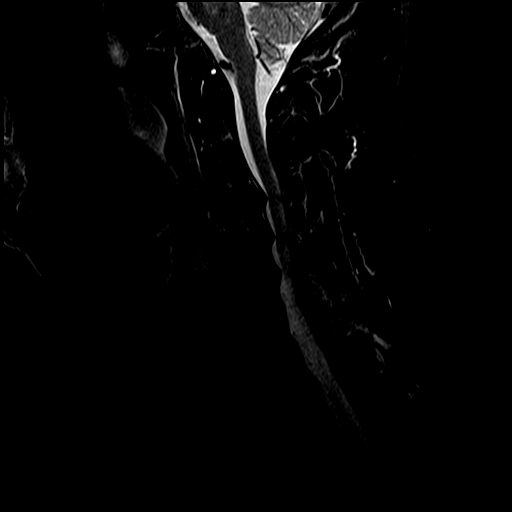
[im 11/13]
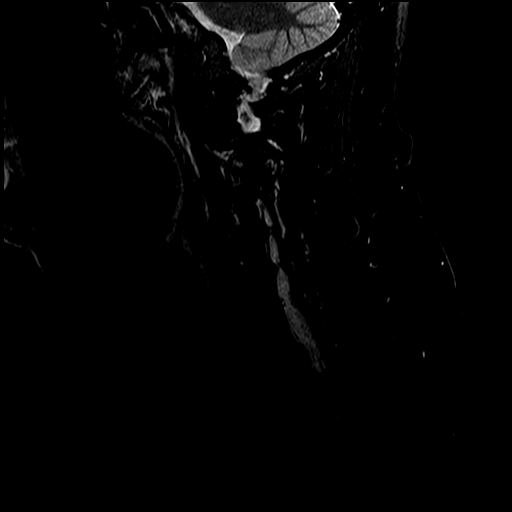

[Series 8: T2 · axial · 3.0mm · 0.39mm/px · z∈[-110,-14]mm · 9 of 18 slices shown (2 of 2)]
[im 1/18]
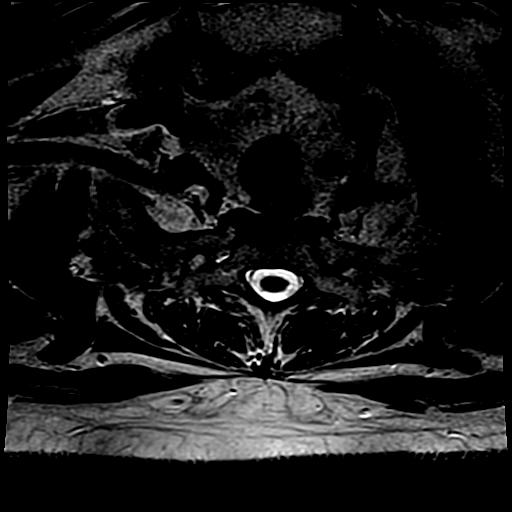
[im 4/18]
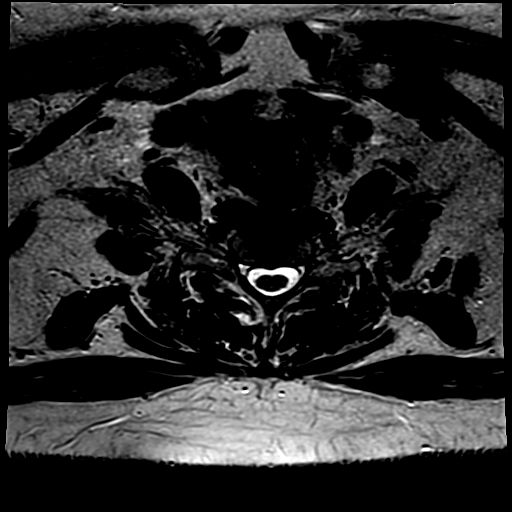
[im 5/18]
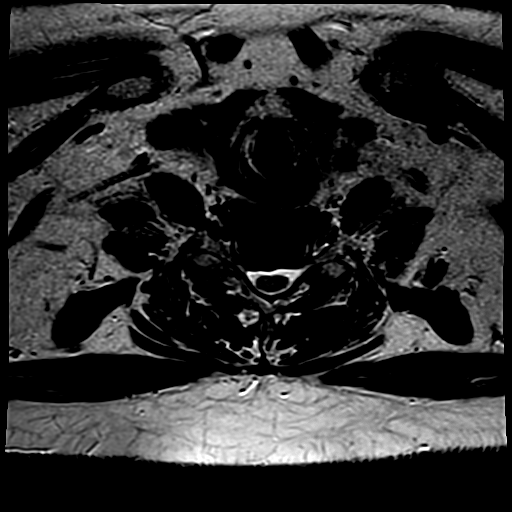
[im 8/18]
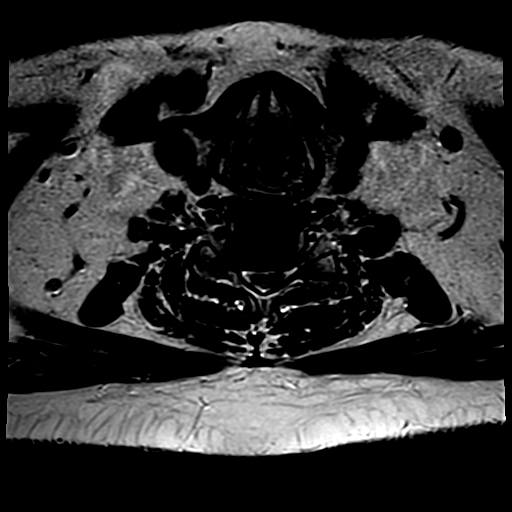
[im 10/18]
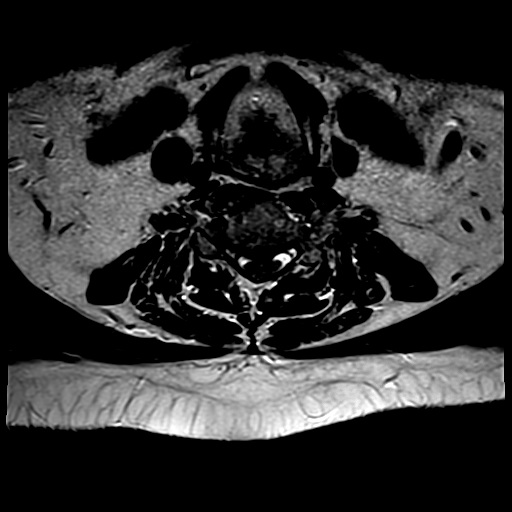
[im 13/18]
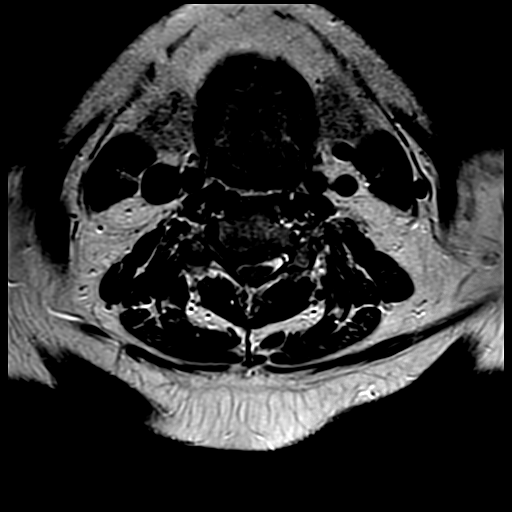
[im 14/18]
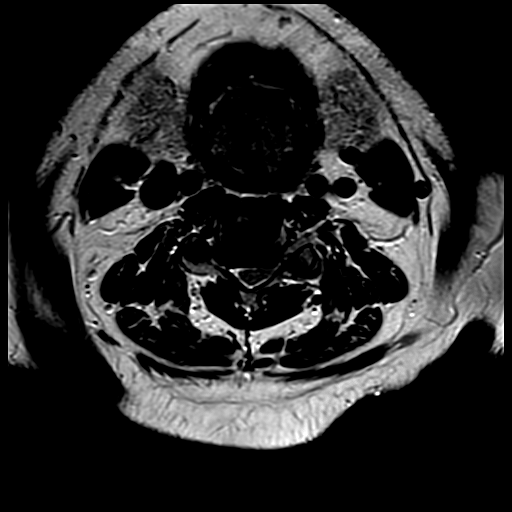
[im 16/18]
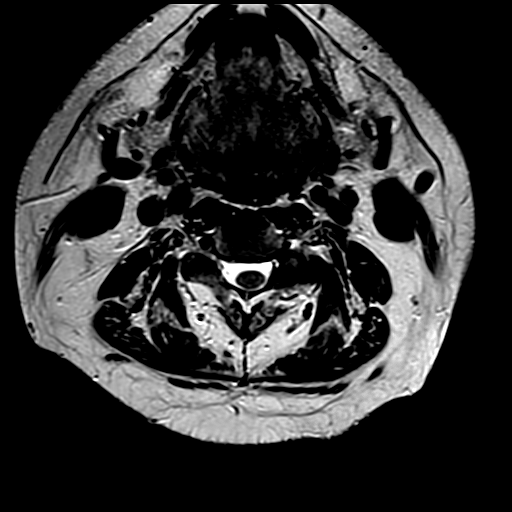
[im 18/18]
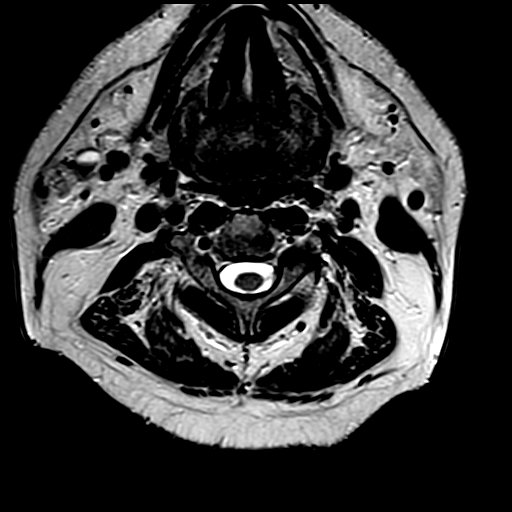

[24 of 48 positions shown; findings below may reference images not displayed]

FINDINGS: There are moderate degenerative and hypertrophic changes at C3-C4, C4-C5, and C5-C6.  There are disc-osteophyte complexes at these three levels producing mild spinal stenosis with compression of the thecal sac but no apparent spinal cord compression.  

There is slight reversal of the normal cervical lordosis.  There is no fracture, significant malalignment, or marrow signal alteration.  

The spinal cord appears unremarkable.  There is no Chiari malformation.
IMPRESSION: Moderate degenerative changes and mild spinal stenosis at C3-C4, C4-C5, and C5-C6.

## 2024-07-25 ENCOUNTER — Other Ambulatory Visit (HOSPITAL_COMMUNITY): Payer: Self-pay | Admitting: Anatomic Pathology & Clinical Pathology

## 2024-07-25 DIAGNOSIS — R69 Illness, unspecified: Secondary | ICD-10-CM

## 2024-07-26 DIAGNOSIS — R69 Illness, unspecified: Secondary | ICD-10-CM

## 2024-08-19 DEATH — deceased

## 2024-09-07 LAB — AUTOPSY, NEUROPATH EXAMINATION: Brain: 1420 g — ABNORMAL HIGH (ref 1200–1400)

## 2024-09-27 LAB — AUTOPSY, ADULT AND PEDIATRIC: Microscopic Description: NORMAL
